# Patient Record
Sex: Female | Born: 1960 | Race: White | Hispanic: No | Smoking: Current every day smoker
Health system: Southern US, Community
[De-identification: ages and names within clinical notes are randomized; demographics above are authoritative.]

## PROBLEM LIST (undated history)

## (undated) DIAGNOSIS — T8859XA Other complications of anesthesia, initial encounter: Secondary | ICD-10-CM

## (undated) DIAGNOSIS — R7303 Prediabetes: Secondary | ICD-10-CM

## (undated) DIAGNOSIS — F419 Anxiety disorder, unspecified: Secondary | ICD-10-CM

## (undated) DIAGNOSIS — G039 Meningitis, unspecified: Secondary | ICD-10-CM

## (undated) DIAGNOSIS — I471 Supraventricular tachycardia, unspecified: Secondary | ICD-10-CM

## (undated) DIAGNOSIS — M539 Dorsopathy, unspecified: Secondary | ICD-10-CM

## (undated) DIAGNOSIS — G5700 Lesion of sciatic nerve, unspecified lower limb: Secondary | ICD-10-CM

## (undated) DIAGNOSIS — I499 Cardiac arrhythmia, unspecified: Secondary | ICD-10-CM

## (undated) DIAGNOSIS — T4145XA Adverse effect of unspecified anesthetic, initial encounter: Secondary | ICD-10-CM

## (undated) HISTORY — PX: DILATION AND CURETTAGE OF UTERUS: SHX78

## (undated) HISTORY — DX: Lesion of sciatic nerve, unspecified lower limb: G57.00

## (undated) HISTORY — DX: Dorsopathy, unspecified: M53.9

## (undated) HISTORY — DX: Supraventricular tachycardia, unspecified: I47.10

## (undated) HISTORY — DX: Supraventricular tachycardia: I47.1

## (undated) HISTORY — PX: FOOT SURGERY: SHX648

---

## 2009-07-21 HISTORY — PX: CERVICAL FUSION: SHX112

## 2015-07-24 ENCOUNTER — Ambulatory Visit: Payer: Self-pay | Admitting: Physician Assistant

## 2015-07-24 ENCOUNTER — Encounter: Payer: Self-pay | Admitting: Physician Assistant

## 2015-07-24 VITALS — BP 152/100 | HR 101 | Temp 97.9°F | Ht 66.0 in | Wt 167.8 lb

## 2015-07-24 DIAGNOSIS — F17219 Nicotine dependence, cigarettes, with unspecified nicotine-induced disorders: Secondary | ICD-10-CM

## 2015-07-24 DIAGNOSIS — J019 Acute sinusitis, unspecified: Secondary | ICD-10-CM

## 2015-07-24 DIAGNOSIS — Z8639 Personal history of other endocrine, nutritional and metabolic disease: Secondary | ICD-10-CM

## 2015-07-24 DIAGNOSIS — I10 Essential (primary) hypertension: Secondary | ICD-10-CM

## 2015-07-24 DIAGNOSIS — Z1329 Encounter for screening for other suspected endocrine disorder: Secondary | ICD-10-CM

## 2015-07-24 DIAGNOSIS — Z131 Encounter for screening for diabetes mellitus: Secondary | ICD-10-CM

## 2015-07-24 MED ORDER — LISINOPRIL 10 MG PO TABS: 10.0000 mg | ORAL_TABLET | Freq: Every day | ORAL | Status: DC

## 2015-07-24 MED ORDER — AZITHROMYCIN 250 MG PO TABS: ORAL_TABLET | ORAL | Status: AC

## 2015-07-24 NOTE — Patient Instructions (Signed)
[facility] with [facility] Think about mammo and PAP Follow up one month

## 2015-07-24 NOTE — Progress Notes (Signed)
BP 152/100 mmHg  Pulse 101  Temp(Src) 97.9 F (36.6 C)  Ht 5\' 6"  (1.676 m)  Wt 167 lb 12 oz (76.091 kg)  BMI 27.09 kg/m2  SpO2 96%   Subjective:    Patient ID: Faith Blair, female    DOB: 06/12/1961, 55 y.o.   MRN: [id number]  HPI: Faith Blair is a 55 y.o. female presenting on 07/24/2015 for Sore Throat; Nasal Congestion; and Ear Pain   URI  This is a new problem. The current episode started 1 to 4 weeks ago (started dec 14). The problem has been gradually improving. There has been no fever. Associated symptoms include chest pain, congestion, coughing, ear pain, headaches, neck pain, a plugged ear sensation, rhinorrhea, sinus pain, sneezing, a sore throat, swollen glands and wheezing. Pertinent negatives include no abdominal pain, diarrhea, dysuria, nausea, rash or vomiting. She has tried sleep, NSAIDs, acetaminophen, antihistamine, decongestant and increased fluids (organic chicken noodle soup) for the symptoms. The treatment provided moderate relief.   Pt was seen in our office one time.  She never got her labs drawn nor followed up.    Relevant past medical, surgical, family and social history reviewed and updated as indicated. Interim medical history since our last visit reviewed. Allergies and medications reviewed and updated.  Review of Systems  Constitutional: Positive for diaphoresis, appetite change and fatigue. Negative for unexpected weight change.  HENT: Positive for congestion, dental problem, ear pain, rhinorrhea, sinus pressure, sneezing, sore throat and voice change. Negative for drooling, facial swelling, hearing loss, mouth sores and trouble swallowing.   Eyes: Positive for pain and itching. Negative for discharge, redness and visual disturbance.  Respiratory: Positive for cough, shortness of breath and wheezing.   Cardiovascular: Positive for chest pain and palpitations. Negative for leg swelling.  Gastrointestinal: Negative for nausea, vomiting, abdominal pain,  diarrhea and constipation.  Endocrine: Positive for heat intolerance. Negative for cold intolerance and polydipsia.  Genitourinary: Negative for dysuria, hematuria and decreased urine volume.  Musculoskeletal: Positive for back pain, arthralgias, gait problem and neck pain.  Skin: Negative for rash.  Allergic/Immunologic: Positive for environmental allergies.  Neurological: Positive for headaches. Negative for seizures, syncope and light-headedness.  Hematological: Positive for adenopathy.  Psychiatric/Behavioral: Positive for dysphoric mood and agitation. Negative for suicidal ideas. The patient is nervous/anxious.     Per HPI unless specifically indicated above     Objective:    BP 152/100 mmHg  Pulse 101  Temp(Src) 97.9 F (36.6 C)  Ht 5\' 6"  (1.676 m)  Wt 167 lb 12 oz (76.091 kg)  BMI 27.09 kg/m2  SpO2 96%  Wt Readings from Last 3 Encounters:  07/24/15 167 lb 12 oz (76.091 kg)    Physical Exam  Constitutional: She is oriented to person, place, and time. She appears well-developed and well-nourished.  HENT:  Head: Normocephalic and atraumatic.  Right Ear: Hearing, tympanic membrane, external ear and ear canal normal.  Left Ear: Hearing, tympanic membrane, external ear and ear canal normal.  Nose: Mucosal edema and rhinorrhea present.  Mouth/Throat: Uvula is midline and oropharynx is clear and moist. No oropharyngeal exudate.  Neck: Neck supple.  Cardiovascular: Normal rate and regular rhythm.   Pulmonary/Chest: Effort normal and breath sounds normal. She has no wheezes.  Lymphadenopathy:    She has no cervical adenopathy.  Neurological: She is alert and oriented to person, place, and time.  Skin: Skin is warm and dry.  Psychiatric: She has a normal mood and affect. Her behavior is normal.  Vitals reviewed.   No results found for this or any previous visit.    Assessment & Plan:   Encounter Diagnoses  Name Primary?  . Acute sinusitis, unspecified Yes  . Essential  hypertension, benign   . History of hyperlipidemia   . Screening for thyroid disorder   . Screening for diabetes mellitus   . Cigarette nicotine dependence with nicotine-induced disorder    -Counseled pt on need for establishing relationship and getting baseline labs so her health can best be managed.  She agrees to get labs drawn and f/u in one month for routine health maintenance. -Rx zpack and gave coupon with it.   -Counseled to avoid smoking to get over URI faster. -Rx lisinopril for BP. -Discussed mammo and PAP but pt unsure if she wants those tests done.  Will discuss further at f/u OV in one month.  RTO sooner prn worsening or new symptoms.

## 2015-07-29 DIAGNOSIS — F17219 Nicotine dependence, cigarettes, with unspecified nicotine-induced disorders: Secondary | ICD-10-CM | POA: Insufficient documentation

## 2015-07-29 DIAGNOSIS — I10 Essential (primary) hypertension: Secondary | ICD-10-CM | POA: Insufficient documentation

## 2015-08-09 ENCOUNTER — Other Ambulatory Visit: Payer: Self-pay | Admitting: Physician Assistant

## 2015-08-09 LAB — CBC WITH DIFFERENTIAL/PLATELET
BASOS ABS: 0 10*3/uL (ref 0.0–0.1)
Basophils Relative: 0 % (ref 0–1)
EOS ABS: 0.3 10*3/uL (ref 0.0–0.7)
Eosinophils Relative: 3 % (ref 0–5)
HEMATOCRIT: 41.2 % (ref 36.0–46.0)
HEMOGLOBIN: 14.2 g/dL (ref 12.0–15.0)
LYMPHS ABS: 2.1 10*3/uL (ref 0.7–4.0)
LYMPHS PCT: 23 % (ref 12–46)
MCH: 30.5 pg (ref 26.0–34.0)
MCHC: 34.5 g/dL (ref 30.0–36.0)
MCV: 88.4 fL (ref 78.0–100.0)
MONOS PCT: 4 % (ref 3–12)
MPV: 9.6 fL (ref 8.6–12.4)
Monocytes Absolute: 0.4 10*3/uL (ref 0.1–1.0)
NEUTROS ABS: 6.5 10*3/uL (ref 1.7–7.7)
NEUTROS PCT: 70 % (ref 43–77)
PLATELETS: 289 10*3/uL (ref 150–400)
RBC: 4.66 MIL/uL (ref 3.87–5.11)
RDW: 13.4 % (ref 11.5–15.5)
WBC: 9.3 10*3/uL (ref 4.0–10.5)

## 2015-08-09 LAB — COMPLETE METABOLIC PANEL WITH GFR
ALT: 17 U/L (ref 6–29)
AST: 17 U/L (ref 10–35)
Albumin: 4.4 g/dL (ref 3.6–5.1)
Alkaline Phosphatase: 99 U/L (ref 33–130)
BUN: 13 mg/dL (ref 7–25)
CHLORIDE: 100 mmol/L (ref 98–110)
CO2: 26 mmol/L (ref 20–31)
Calcium: 10 mg/dL (ref 8.6–10.4)
Creat: 0.42 mg/dL — ABNORMAL LOW (ref 0.50–1.05)
GLUCOSE: 122 mg/dL — AB (ref 65–99)
POTASSIUM: 4.4 mmol/L (ref 3.5–5.3)
SODIUM: 137 mmol/L (ref 135–146)
Total Bilirubin: 0.5 mg/dL (ref 0.2–1.2)
Total Protein: 7.6 g/dL (ref 6.1–8.1)

## 2015-08-09 LAB — LIPID PANEL
CHOL/HDL RATIO: 9.2 ratio — AB (ref <=5.0)
CHOLESTEROL: 295 mg/dL — AB (ref 125–200)
HDL: 32 mg/dL — ABNORMAL LOW (ref >=46)
LDL CALC: 227 mg/dL — AB (ref <130)
Triglycerides: 182 mg/dL — ABNORMAL HIGH (ref <150)
VLDL: 36 mg/dL — AB (ref <30)

## 2015-08-09 LAB — TSH: TSH: 0.86 u[IU]/mL (ref 0.350–4.500)

## 2015-08-10 LAB — HEMOGLOBIN A1C
Hgb A1c MFr Bld: 6.8 % — ABNORMAL HIGH (ref <5.7)
Mean Plasma Glucose: 148 mg/dL — ABNORMAL HIGH (ref <117)

## 2015-08-27 ENCOUNTER — Encounter: Payer: Self-pay | Admitting: Physician Assistant

## 2015-08-27 ENCOUNTER — Ambulatory Visit: Payer: Self-pay | Admitting: Physician Assistant

## 2015-08-27 VITALS — BP 136/84 | HR 93 | Temp 97.9°F | Ht 66.0 in | Wt 164.5 lb

## 2015-08-27 DIAGNOSIS — I10 Essential (primary) hypertension: Secondary | ICD-10-CM

## 2015-08-27 DIAGNOSIS — E785 Hyperlipidemia, unspecified: Secondary | ICD-10-CM

## 2015-08-27 DIAGNOSIS — E119 Type 2 diabetes mellitus without complications: Secondary | ICD-10-CM | POA: Insufficient documentation

## 2015-08-27 DIAGNOSIS — F17219 Nicotine dependence, cigarettes, with unspecified nicotine-induced disorders: Secondary | ICD-10-CM

## 2015-08-27 MED ORDER — EZETIMIBE 10 MG PO TABS: 10.0000 mg | ORAL_TABLET | Freq: Every day | ORAL | Status: DC

## 2015-08-27 MED ORDER — LISINOPRIL 10 MG PO TABS: 10.0000 mg | ORAL_TABLET | Freq: Every day | ORAL | Status: DC

## 2015-08-27 NOTE — Progress Notes (Signed)
BP 136/84 mmHg  Pulse 93  Temp(Src) 97.9 F (36.6 C)  Ht '5\' 6"'$  (1.676 m)  Wt 164 lb 8 oz (74.617 kg)  BMI 26.56 kg/m2  SpO2 98%   Subjective:    Patient ID: Faith Blair, female    DOB: September 11, 1960, 55 y.o.   MRN: [id number]  HPI: Teanna Elem is a 55 y.o. female presenting on 08/27/2015 for Hypertension   HPI   Pt says she is feeling much better since starting lisinopril.  She admits to still being under a lot of stress with her living situation.  Relevant past medical, surgical, family and social history reviewed and updated as indicated. Interim medical history since our last visit reviewed. Allergies and medications reviewed and updated.   Current outpatient prescriptions:  .  Ibuprofen (ADVIL PO), Take 600 mg by mouth 3 (three) times daily as needed. , Disp: , Rfl:  .  lisinopril (PRINIVIL,ZESTRIL) 10 MG tablet, Take 1 tablet (10 mg total) by mouth daily., Disp: 30 tablet, Rfl: 1 .  Omega-3 Fatty Acids (FISH OIL PO), Take 1,000 mg by mouth daily., Disp: , Rfl:    Review of Systems  Constitutional: Positive for fatigue. Negative for fever, chills, diaphoresis, appetite change and unexpected weight change.  HENT: Positive for congestion, dental problem and sneezing. Negative for drooling, ear pain, facial swelling, hearing loss, mouth sores, sore throat, trouble swallowing and voice change.   Eyes: Negative for pain, discharge, redness, itching and visual disturbance.  Respiratory: Positive for shortness of breath. Negative for cough, choking and wheezing.   Cardiovascular: Positive for palpitations. Negative for chest pain and leg swelling.  Gastrointestinal: Negative for vomiting, abdominal pain, diarrhea, constipation and blood in stool.  Endocrine: Negative for cold intolerance, heat intolerance and polydipsia.  Genitourinary: Negative for dysuria, hematuria and decreased urine volume.  Musculoskeletal: Positive for back pain and arthralgias. Negative for gait problem.   Skin: Negative for rash.  Allergic/Immunologic: Positive for environmental allergies.  Neurological: Negative for seizures, syncope, light-headedness and headaches.  Hematological: Negative for adenopathy.  Psychiatric/Behavioral: Negative for suicidal ideas, dysphoric mood and agitation. The patient is nervous/anxious.     Per HPI unless specifically indicated above     Objective:    BP 136/84 mmHg  Pulse 93  Temp(Src) 97.9 F (36.6 C)  Ht '5\' 6"'$  (1.676 m)  Wt 164 lb 8 oz (74.617 kg)  BMI 26.56 kg/m2  SpO2 98%  Wt Readings from Last 3 Encounters:  08/27/15 164 lb 8 oz (74.617 kg)  07/24/15 167 lb 12 oz (76.091 kg)    Physical Exam  Constitutional: She is oriented to person, place, and time. She appears well-developed and well-nourished.  HENT:  Head: Normocephalic and atraumatic.  Neck: Neck supple.  Cardiovascular: Normal rate and regular rhythm.   Pulmonary/Chest: Effort normal and breath sounds normal.  Abdominal: Soft. Bowel sounds are normal. She exhibits no mass. There is no hepatosplenomegaly. There is no tenderness.  Musculoskeletal: She exhibits no edema.  Lymphadenopathy:    She has no cervical adenopathy.  Neurological: She is alert and oriented to person, place, and time.  Skin: Skin is warm and dry.  Psychiatric: She has a normal mood and affect. Her behavior is normal.  Vitals reviewed.   Results for orders placed or performed in visit on 08/09/15  COMPLETE METABOLIC PANEL WITH GFR  Result Value Ref Range   Sodium 137 135 - 146 mmol/L   Potassium 4.4 3.5 - 5.3 mmol/L   Chloride 100 98 -  110 mmol/L   CO2 26 20 - 31 mmol/L   Glucose, Bld 122 (H) 65 - 99 mg/dL   BUN 13 7 - 25 mg/dL   Creat 0.42 (L) 0.50 - 1.05 mg/dL   Total Bilirubin 0.5 0.2 - 1.2 mg/dL   Alkaline Phosphatase 99 33 - 130 U/L   AST 17 10 - 35 U/L   ALT 17 6 - 29 U/L   Total Protein 7.6 6.1 - 8.1 g/dL   Albumin 4.4 3.6 - 5.1 g/dL   Calcium 10.0 8.6 - 10.4 mg/dL   GFR, Est African  American >89 >=60 mL/min   GFR, Est Non African American >89 >=60 mL/min  CBC with Differential/Platelet  Result Value Ref Range   WBC 9.3 4.0 - 10.5 K/uL   RBC 4.66 3.87 - 5.11 MIL/uL   Hemoglobin 14.2 12.0 - 15.0 g/dL   HCT 41.2 36.0 - 46.0 %   MCV 88.4 78.0 - 100.0 fL   MCH 30.5 26.0 - 34.0 pg   MCHC 34.5 30.0 - 36.0 g/dL   RDW 13.4 11.5 - 15.5 %   Platelets 289 150 - 400 K/uL   MPV 9.6 8.6 - 12.4 fL   Neutrophils Relative % 70 43 - 77 %   Neutro Abs 6.5 1.7 - 7.7 K/uL   Lymphocytes Relative 23 12 - 46 %   Lymphs Abs 2.1 0.7 - 4.0 K/uL   Monocytes Relative 4 3 - 12 %   Monocytes Absolute 0.4 0.1 - 1.0 K/uL   Eosinophils Relative 3 0 - 5 %   Eosinophils Absolute 0.3 0.0 - 0.7 K/uL   Basophils Relative 0 0 - 1 %   Basophils Absolute 0.0 0.0 - 0.1 K/uL   Smear Review Criteria for review not met   Lipid panel  Result Value Ref Range   Cholesterol 295 (H) 125 - 200 mg/dL   Triglycerides 182 (H) <150 mg/dL   HDL 32 (L) >=46 mg/dL   Total CHOL/HDL Ratio 9.2 (H) <=5.0 Ratio   VLDL 36 (H) <30 mg/dL   LDL Cholesterol 227 (H) <130 mg/dL  TSH  Result Value Ref Range   TSH 0.860 0.350 - 4.500 uIU/mL  Hemoglobin A1c  Result Value Ref Range   Hgb A1c MFr Bld 6.8 (H) <5.7 %   Mean Plasma Glucose 148 (H) <117 mg/dL      Assessment & Plan:   Encounter Diagnoses  Name Primary?  . Essential hypertension, benign Yes  . Cigarette nicotine dependence with nicotine-induced disorder   . Type 2 diabetes mellitus without complication, unspecified long term insulin use status ([place])   . Hyperlipidemia     -reviewed labs with pt  -Will do pap here later- at next OV -Pt wants to skip mammo.  Discussed risks of not getting test.  Pt says she may change her mind in the future but she does not want a mammogram at this time -Pt doesn't want pills for dm.  She will try to improve with diet and exercise.  Discussed risks of untreated diabetes. Pt says she wants to try to treat it without  medication b/c she doesn't want to be taking a lot of pills.  counseled pt on diabetic diet and gave reading information.  She will need to attend dm educuation class -Discussed chantix- pt says it didn't work for her and it made her hyper. Counseled on smoking cessation -will sign up pt  for [facility]- order zetia for her cholesterol.  She will follow  low-fat diet and exercise as well. -F/u 20mo She is to RTO sooner prn. -diabetic foot exam at next OV

## 2015-08-27 NOTE — Patient Instructions (Addendum)
Diabetes Mellitus and Food It is important for you to manage your blood sugar (glucose) level. Your blood glucose level can be greatly affected by what you eat. Eating healthier foods in the appropriate amounts throughout the day at about the same time each day will help you control your blood glucose level. It can also help slow or prevent worsening of your diabetes mellitus. Healthy eating may even help you improve the level of your blood pressure and reach or maintain a healthy weight.  General recommendations for healthful eating and cooking habits include:  Eating meals and snacks regularly. Avoid going long periods of time without eating to lose weight.  Eating a diet that consists mainly of plant-based foods, such as fruits, vegetables, nuts, legumes, and whole grains.  Using low-heat cooking methods, such as baking, instead of high-heat cooking methods, such as deep frying. Work with your dietitian to make sure you understand how to use the Nutrition Facts information on food labels. HOW CAN FOOD AFFECT ME? Carbohydrates Carbohydrates affect your blood glucose level more than any other type of food. Your dietitian will help you determine how many carbohydrates to eat at each meal and teach you how to count carbohydrates. Counting carbohydrates is important to keep your blood glucose at a healthy level, especially if you are using insulin or taking certain medicines for diabetes mellitus. Alcohol Alcohol can cause sudden decreases in blood glucose (hypoglycemia), especially if you use insulin or take certain medicines for diabetes mellitus. Hypoglycemia can be a life-threatening condition. Symptoms of hypoglycemia (sleepiness, dizziness, and disorientation) are similar to symptoms of having too much alcohol.  If your health care provider has given you approval to drink alcohol, do so in moderation and use the following guidelines:  Women should not have more than one drink per day, and men  should not have more than two drinks per day. One drink is equal to:  12 oz of beer.  5 oz of wine.  1 oz of hard liquor.  Do not drink on an empty stomach.  Keep yourself hydrated. Have water, diet soda, or unsweetened iced tea.  Regular soda, juice, and other mixers might contain a lot of carbohydrates and should be counted. WHAT FOODS ARE NOT RECOMMENDED? As you make food choices, it is important to remember that all foods are not the same. Some foods have fewer nutrients per serving than other foods, even though they might have the same number of calories or carbohydrates. It is difficult to get your body what it needs when you eat foods with fewer nutrients. Examples of foods that you should avoid that are high in calories and carbohydrates but low in nutrients include:  Trans fats (most processed foods list trans fats on the Nutrition Facts label).  Regular soda.  Juice.  Candy.  Sweets, such as cake, pie, doughnuts, and cookies.  Fried foods. WHAT FOODS CAN I EAT? Eat nutrient-rich foods, which will nourish your body and keep you healthy. The food you should eat also will depend on several factors, including:  The calories you need.  The medicines you take.  Your weight.  Your blood glucose level.  Your blood pressure level.  Your cholesterol level. You should eat a variety of foods, including:  Protein.  Lean cuts of meat.  Proteins low in saturated fats, such as fish, egg whites, and beans. Avoid processed meats.  Fruits and vegetables.  Fruits and vegetables that may help control blood glucose levels, such as apples, mangoes, and   yams.  Dairy products.  Choose fat-free or low-fat dairy products, such as milk, yogurt, and cheese.  Grains, bread, pasta, and rice.  Choose whole grain products, such as multigrain bread, whole oats, and brown rice. These foods may help control blood pressure.  Fats.  Foods containing healthful fats, such as nuts,  avocado, olive oil, canola oil, and fish. DOES EVERYONE WITH DIABETES MELLITUS HAVE THE SAME MEAL PLAN? Because every person with diabetes mellitus is different, there is not one meal plan that works for everyone. It is very important that you meet with a dietitian who will help you create a meal plan that is just right for you.   This information is not intended to replace advice given to you by your health care provider. Make sure you discuss any questions you have with your health care provider.   Document Released: 04/03/2005 Document Revised: 07/28/2014 Document Reviewed: 06/03/2013 Elsevier Interactive Patient Education 2016 [employer]    Fat and Cholesterol Restricted Diet High levels of fat and cholesterol in your blood may lead to various health problems, such as diseases of the heart, blood vessels, gallbladder, liver, and pancreas. Fats are concentrated sources of energy that come in various forms. Certain types of fat, including saturated fat, may be harmful in excess. Cholesterol is a substance needed by your body in small amounts. Your body makes all the cholesterol it needs. Excess cholesterol comes from the food you eat. When you have high levels of cholesterol and saturated fat in your blood, health problems can develop because the excess fat and cholesterol will gather along the walls of your blood vessels, causing them to narrow. Choosing the right foods will help you control your intake of fat and cholesterol. This will help keep the levels of these substances in your blood within normal limits and reduce your risk of disease. WHAT IS MY PLAN? Your health care provider recommends that you:  Get no more than __________ % of the total calories in your daily diet from fat.  Limit your intake of saturated fat to less than ______% of your total calories each day.  Limit the amount of cholesterol in your diet to less than _________mg per day. WHAT TYPES OF FAT SHOULD I  CHOOSE?  Choose healthy fats more often. Choose monounsaturated and polyunsaturated fats, such as olive and canola oil, flaxseeds, walnuts, almonds, and seeds.  Eat more omega-3 fats. Good choices include salmon, mackerel, sardines, tuna, flaxseed oil, and ground flaxseeds. Aim to eat fish at least two times a week.  Limit saturated fats. Saturated fats are primarily found in animal products, such as meats, butter, and cream. Plant sources of saturated fats include palm oil, palm kernel oil, and coconut oil.  Avoid foods with partially hydrogenated oils in them. These contain trans fats. Examples of foods that contain trans fats are stick margarine, some tub margarines, cookies, crackers, and other baked goods. WHAT GENERAL GUIDELINES DO I NEED TO FOLLOW? These guidelines for healthy eating will help you control your intake of fat and cholesterol:  Check food labels carefully to identify foods with trans fats or high amounts of saturated fat.  Fill one half of your plate with vegetables and green salads.  Fill one fourth of your plate with whole grains. Look for the word "whole" as the first word in the ingredient list.  Fill one fourth of your plate with lean protein foods.  Limit fruit to two servings a day. Choose fruit instead of juice.    Eat more foods that contain soluble fiber. Examples of foods that contain this type of fiber are apples, broccoli, carrots, beans, peas, and barley. Aim to get 20-30 g of fiber per day.  Eat more home-cooked food and less restaurant, buffet, and fast food.  Limit or avoid alcohol.  Limit foods high in starch and sugar.  Limit fried foods.  Cook foods using methods other than frying. Baking, boiling, grilling, and broiling are all great options.  Lose weight if you are overweight. Losing just 5-10% of your initial body weight can help your overall health and prevent diseases such as diabetes and heart disease. WHAT FOODS CAN I  EAT? Grains Whole grains, such as whole wheat or whole grain breads, crackers, cereals, and pasta. Unsweetened oatmeal, bulgur, barley, quinoa, or brown rice. Corn or whole wheat flour tortillas. Vegetables Fresh or frozen vegetables (raw, steamed, roasted, or grilled). Green salads. Fruits All fresh, canned (in natural juice), or frozen fruits. Meat and Other Protein Products Ground beef (85% or leaner), grass-fed beef, or beef trimmed of fat. Skinless chicken or turkey. Ground chicken or turkey. Pork trimmed of fat. All fish and seafood. Eggs. Dried beans, peas, or lentils. Unsalted nuts or seeds. Unsalted canned or dry beans. Dairy Low-fat dairy products, such as skim or 1% milk, 2% or reduced-fat cheeses, low-fat ricotta or cottage cheese, or plain low-fat yogurt. Fats and Oils Tub margarines without trans fats. Light or reduced-fat mayonnaise and salad dressings. Avocado. Olive, canola, sesame, or safflower oils. Natural peanut or almond butter (choose ones without added sugar and oil). The items listed above may not be a complete list of recommended foods or beverages. Contact your dietitian for more options. WHAT FOODS ARE NOT RECOMMENDED? Grains White bread. White pasta. White rice. Cornbread. Bagels, pastries, and croissants. Crackers that contain trans fat. Vegetables White potatoes. Corn. Creamed or fried vegetables. Vegetables in a cheese sauce. Fruits Dried fruits. Canned fruit in light or heavy syrup. Fruit juice. Meat and Other Protein Products Fatty cuts of meat. Ribs, chicken wings, bacon, sausage, bologna, salami, chitterlings, fatback, hot dogs, bratwurst, and packaged luncheon meats. Liver and organ meats. Dairy Whole or 2% milk, cream, half-and-half, and cream cheese. Whole milk cheeses. Whole-fat or sweetened yogurt. Full-fat cheeses. Nondairy creamers and whipped toppings. Processed cheese, cheese spreads, or cheese curds. Sweets and Desserts Corn syrup, sugars,  honey, and molasses. Candy. Jam and jelly. Syrup. Sweetened cereals. Cookies, pies, cakes, donuts, muffins, and ice cream. Fats and Oils Butter, stick margarine, lard, shortening, ghee, or bacon fat. Coconut, palm kernel, or palm oils. Beverages Alcohol. Sweetened drinks (such as sodas, lemonade, and fruit drinks or punches). The items listed above may not be a complete list of foods and beverages to avoid. Contact your dietitian for more information.   This information is not intended to replace advice given to you by your health care provider. Make sure you discuss any questions you have with your health care provider.   Document Released: 07/07/2005 Document Revised: 07/28/2014 Document Reviewed: 10/05/2013 Elsevier Interactive Patient Education 2016 [employer]  

## 2015-08-28 ENCOUNTER — Other Ambulatory Visit: Payer: Self-pay | Admitting: Physician Assistant

## 2015-08-28 ENCOUNTER — Ambulatory Visit: Payer: Self-pay | Admitting: Physician Assistant

## 2015-08-28 MED ORDER — EZETIMIBE 10 MG PO TABS: 10.0000 mg | ORAL_TABLET | Freq: Every day | ORAL | Status: DC

## 2015-11-14 ENCOUNTER — Other Ambulatory Visit: Payer: Self-pay

## 2015-11-14 DIAGNOSIS — E785 Hyperlipidemia, unspecified: Secondary | ICD-10-CM

## 2015-11-14 DIAGNOSIS — I10 Essential (primary) hypertension: Secondary | ICD-10-CM

## 2015-11-14 DIAGNOSIS — E119 Type 2 diabetes mellitus without complications: Secondary | ICD-10-CM

## 2015-11-16 LAB — LIPID PANEL
Cholesterol: 280 mg/dL — ABNORMAL HIGH (ref 125–200)
HDL: 37 mg/dL — ABNORMAL LOW (ref >=46)
LDL Cholesterol: 194 mg/dL — ABNORMAL HIGH (ref <130)
Total CHOL/HDL Ratio: 7.6 Ratio — ABNORMAL HIGH (ref <=5.0)
Triglycerides: 244 mg/dL — ABNORMAL HIGH (ref <150)
VLDL: 49 mg/dL — AB (ref <30)

## 2015-11-16 LAB — COMPLETE METABOLIC PANEL WITH GFR
ALBUMIN: 4.6 g/dL (ref 3.6–5.1)
ALK PHOS: 92 U/L (ref 33–130)
ALT: 12 U/L (ref 6–29)
AST: 14 U/L (ref 10–35)
BUN: 19 mg/dL (ref 7–25)
CO2: 26 mmol/L (ref 20–31)
Calcium: 9.9 mg/dL (ref 8.6–10.4)
Chloride: 99 mmol/L (ref 98–110)
Creat: 0.69 mg/dL (ref 0.50–1.05)
GFR, Est African American: >89 mL/min (ref >=60)
GFR, Est Non African American: >89 mL/min (ref >=60)
GLUCOSE: 105 mg/dL — AB (ref 65–99)
POTASSIUM: 4.7 mmol/L (ref 3.5–5.3)
SODIUM: 137 mmol/L (ref 135–146)
Total Bilirubin: 0.4 mg/dL (ref 0.2–1.2)
Total Protein: 7.8 g/dL (ref 6.1–8.1)

## 2015-11-16 LAB — HEMOGLOBIN A1C
HEMOGLOBIN A1C: 6.3 % — AB (ref <5.7)
MEAN PLASMA GLUCOSE: 134 mg/dL

## 2015-11-17 LAB — MICROALBUMIN, URINE: MICROALB UR: 0.7 mg/dL

## 2015-11-22 ENCOUNTER — Encounter: Payer: Self-pay | Admitting: Physician Assistant

## 2015-11-22 ENCOUNTER — Ambulatory Visit: Payer: Self-pay | Admitting: Physician Assistant

## 2015-11-22 ENCOUNTER — Other Ambulatory Visit: Payer: Self-pay | Admitting: Physician Assistant

## 2015-11-22 VITALS — BP 100/66 | HR 100 | Temp 97.9°F | Ht 66.0 in | Wt 160.2 lb

## 2015-11-22 DIAGNOSIS — E119 Type 2 diabetes mellitus without complications: Secondary | ICD-10-CM

## 2015-11-22 DIAGNOSIS — E785 Hyperlipidemia, unspecified: Secondary | ICD-10-CM

## 2015-11-22 DIAGNOSIS — I10 Essential (primary) hypertension: Secondary | ICD-10-CM

## 2015-11-22 DIAGNOSIS — Z124 Encounter for screening for malignant neoplasm of cervix: Secondary | ICD-10-CM

## 2015-11-22 DIAGNOSIS — F17210 Nicotine dependence, cigarettes, uncomplicated: Secondary | ICD-10-CM

## 2015-11-22 DIAGNOSIS — Z1211 Encounter for screening for malignant neoplasm of colon: Secondary | ICD-10-CM

## 2015-11-22 MED ORDER — COLESEVELAM HCL 625 MG PO TABS: 1875.0000 mg | ORAL_TABLET | Freq: Two times a day (BID) | ORAL | Status: DC

## 2015-11-22 NOTE — Progress Notes (Signed)
BP 100/66 mmHg  Pulse 100  Temp(Src) 97.9 F (36.6 C)  Ht 5\' 6"  (1.676 m)  Wt 160 lb 3.2 oz (72.666 kg)  BMI 25.87 kg/m2  SpO2 97%   Subjective:    Patient ID: Faith Blair, female    DOB: 07/01/1961, [age] y.o.   MRN: [id number]  HPI: Faith Lamerani Gracey is a 55 y.o. female presenting on 11/22/2015 for Diabetes and Gynecologic Exam   HPI   Pt stopped her zetia- she only took it for about a week b/c she says it made her have hot flashes.  Pt took lipitor in the past and it made her kidneys hurt.  She never tried any other statins.  Pt still doesn't want mammogram  She is still smoking  Ready for pap today- last one 4 years ago was normal. Has 2 kids.  LMP  10/18/15  Relevant past medical, surgical, family and social history reviewed and updated as indicated. Interim medical history since our last visit reviewed.  Allergies and medications reviewed and updated.   Current outpatient prescriptions:  .  Ibuprofen (ADVIL PO), Take 600 mg by mouth 3 (three) times daily as needed. , Disp: , Rfl:  .  lisinopril (PRINIVIL,ZESTRIL) 10 MG tablet, Take 1 tablet (10 mg total) by mouth daily., Disp: 30 tablet, Rfl: 3 .  Omega-3 Fatty Acids (FISH OIL PO), Take 1,000 mg by mouth daily., Disp: , Rfl:    Review of Systems  Constitutional: Positive for diaphoresis. Negative for fever, chills, appetite change, fatigue and unexpected weight change.  HENT: Positive for congestion, dental problem and sneezing. Negative for drooling, ear pain, facial swelling, hearing loss, mouth sores, sore throat, trouble swallowing and voice change.   Eyes: Positive for itching. Negative for pain, discharge, redness and visual disturbance.  Respiratory: Positive for cough and wheezing. Negative for choking and shortness of breath.   Cardiovascular: Positive for palpitations. Negative for chest pain and leg swelling.  Gastrointestinal: Negative for vomiting, abdominal pain, diarrhea, constipation and blood in stool.   Endocrine: Positive for heat intolerance. Negative for cold intolerance and polydipsia.  Genitourinary: Negative for dysuria, hematuria and decreased urine volume.  Musculoskeletal: Positive for back pain and arthralgias. Negative for gait problem.  Skin: Negative for rash.  Allergic/Immunologic: Positive for environmental allergies.  Neurological: Negative for seizures, syncope, light-headedness and headaches.  Hematological: Negative for adenopathy.  Psychiatric/Behavioral: Negative for suicidal ideas, dysphoric mood and agitation. The patient is nervous/anxious.     Per HPI unless specifically indicated above     Objective:    BP 100/66 mmHg  Pulse 100  Temp(Src) 97.9 F (36.6 C)  Ht 5\' 6"  (1.676 m)  Wt 160 lb 3.2 oz (72.666 kg)  BMI 25.87 kg/m2  SpO2 97%  Wt Readings from Last 3 Encounters:  11/22/15 160 lb 3.2 oz (72.666 kg)  08/27/15 164 lb 8 oz (74.617 kg)  07/24/15 167 lb 12 oz (76.091 kg)    Physical Exam  Constitutional: She is oriented to person, place, and time. She appears well-developed and well-nourished.  Neck: Neck supple.  Cardiovascular: Normal rate, regular rhythm and normal heart sounds.   Pulmonary/Chest: Effort normal and breath sounds normal.  Abdominal: Soft. Bowel sounds are normal. She exhibits no mass. There is no tenderness.  Genitourinary: Vagina normal. No breast swelling, tenderness, discharge or bleeding. Uterus is not enlarged and not tender. Cervix exhibits no motion tenderness, no discharge and no friability. Right adnexum displays no mass, no tenderness and no fullness. Left adnexum  displays no mass, no tenderness and no fullness.  (nurse Berenice assisted)  Neurological: She is alert and oriented to person, place, and time.  Skin: Skin is warm and dry.  Psychiatric: She has a normal mood and affect. Her behavior is normal.  Nursing note and vitals reviewed.   Foot exam done   Results for orders placed or performed in visit on 11/14/15   COMPLETE METABOLIC PANEL WITH GFR  Result Value Ref Range   Sodium 137 135 - 146 mmol/L   Potassium 4.7 3.5 - 5.3 mmol/L   Chloride 99 98 - 110 mmol/L   CO2 26 20 - 31 mmol/L   Glucose, Bld 105 (H) 65 - 99 mg/dL   BUN 19 7 - 25 mg/dL   Creat 1.61 0.96 - 0.45 mg/dL   Total Bilirubin 0.4 0.2 - 1.2 mg/dL   Alkaline Phosphatase 92 33 - 130 U/L   AST 14 10 - 35 U/L   ALT 12 6 - 29 U/L   Total Protein 7.8 6.1 - 8.1 g/dL   Albumin 4.6 3.6 - 5.1 g/dL   Calcium 9.9 8.6 - 40.9 mg/dL   GFR, Est African American >89 >=60 mL/min   GFR, Est Non African American >89 >=60 mL/min  Microalbumin, urine  Result Value Ref Range   Microalb, Ur 0.7 Not estab mg/dL  Lipid Profile  Result Value Ref Range   Cholesterol 280 (H) 125 - 200 mg/dL   Triglycerides 811 (H) <150 mg/dL   HDL 37 (L) >=91 mg/dL   Total CHOL/HDL Ratio 7.6 (H) <=5.0 Ratio   VLDL 49 (H) <30 mg/dL   LDL Cholesterol 478 (H) <130 mg/dL  HgB G9F  Result Value Ref Range   Hgb A1c MFr Bld 6.3 (H) <5.7 %   Mean Plasma Glucose 134 mg/dL      Assessment & Plan:   Encounter Diagnoses  Name Primary?  . Essential hypertension, benign Yes  . Cigarette nicotine dependence without complication   . Hyperlipidemia   . Pap smear for cervical cancer screening   . Diabetes mellitus type 2, diet-controlled (HCC)   . Special screening for malignant neoplasms, colon     -Reviewed labs with pt -Will call with pap result -Try welchol for liids. Samples given -Diet- controlled diabetes- discussed with pt that she will have to continue to watch her diet, exercise regularly in order to avoid medication.  Also, with time, the disease likely to progress so that she will need medication in the future -Pt went to eye doctor 10/22/15 at [facility]-  Record request sent -Gave iFOBT for colon cancer screening -Counseled smoking cessation -F/u 6 wk med check (welchol) (spent 45 minutes with pt)

## 2015-11-23 LAB — PAP, THINPREP RFLX HPV

## 2015-11-24 DIAGNOSIS — E119 Type 2 diabetes mellitus without complications: Secondary | ICD-10-CM | POA: Insufficient documentation

## 2015-11-24 DIAGNOSIS — F17210 Nicotine dependence, cigarettes, uncomplicated: Secondary | ICD-10-CM | POA: Insufficient documentation

## 2016-01-07 ENCOUNTER — Encounter: Payer: Self-pay | Admitting: Physician Assistant

## 2016-01-07 ENCOUNTER — Ambulatory Visit: Payer: Self-pay | Admitting: Physician Assistant

## 2016-01-07 VITALS — BP 128/70 | HR 98 | Temp 97.7°F | Ht 66.0 in | Wt 162.6 lb

## 2016-01-07 DIAGNOSIS — F17219 Nicotine dependence, cigarettes, with unspecified nicotine-induced disorders: Secondary | ICD-10-CM

## 2016-01-07 DIAGNOSIS — I10 Essential (primary) hypertension: Secondary | ICD-10-CM

## 2016-01-07 DIAGNOSIS — E785 Hyperlipidemia, unspecified: Secondary | ICD-10-CM

## 2016-01-07 DIAGNOSIS — E119 Type 2 diabetes mellitus without complications: Secondary | ICD-10-CM

## 2016-01-07 NOTE — Progress Notes (Signed)
BP 128/70 mmHg  Pulse 98  Temp(Src) 97.7 F (36.5 C)  Ht  (1.676 m)  Wt 162 lb 9.6 oz (73.755 kg)  BMI 26.26 kg/m2  SpO2 96%   Subjective:    Patient ID: Faith Blair, female    DOB: Dec 11, 1960, 55 y.o.   MRN: [id number]  HPI: Faith Blair is a 55 y.o. female presenting on 01/07/2016 for Hyperlipidemia and Medication Management   HPI   Pt is doing well with the welchol.  No side effects that she has noticed.    Pt c/o lots of stress lately with her pet died recently and her mother is in final stages of dementia.   Relevant past medical, surgical, family and social history reviewed and updated as indicated. Interim medical history since our last visit reviewed. Allergies and medications reviewed and updated.   Current outpatient prescriptions:  .  Ibuprofen (ADVIL PO), Take 600 mg by mouth 3 (three) times daily as needed. , Disp: , Rfl:  .  lisinopril (PRINIVIL,ZESTRIL) 10 MG tablet, Take 1 tablet (10 mg total) by mouth daily., Disp: 30 tablet, Rfl: 3 .  colesevelam (WELCHOL) 625 MG tablet, Take 3 tablets (1,875 mg total) by mouth 2 (two) times daily with a meal. (Patient not taking: Reported on 01/07/2016), Disp: 540 tablet, Rfl: 1 .  Omega-3 Fatty Acids (FISH OIL PO), Take 1,000 mg by mouth daily. Reported on 01/07/2016, Disp: , Rfl:    Review of Systems  Constitutional: Positive for chills, diaphoresis and fatigue. Negative for fever, appetite change and unexpected weight change.  HENT: Positive for congestion, dental problem and sneezing. Negative for drooling, ear pain, facial swelling, hearing loss, mouth sores, sore throat, trouble swallowing and voice change.   Eyes: Positive for itching. Negative for pain, discharge, redness and visual disturbance.  Respiratory: Negative for cough, choking, shortness of breath and wheezing.   Cardiovascular: Negative for chest pain, palpitations and leg swelling.  Gastrointestinal: Negative for vomiting, abdominal pain, diarrhea,  constipation and blood in stool.  Endocrine: Positive for heat intolerance. Negative for cold intolerance and polydipsia.  Genitourinary: Negative for dysuria, hematuria and decreased urine volume.  Musculoskeletal: Positive for back pain and arthralgias. Negative for gait problem.  Skin: Negative for rash.  Allergic/Immunologic: Positive for environmental allergies.  Neurological: Negative for seizures, syncope, light-headedness and headaches.  Hematological: Negative for adenopathy.  Psychiatric/Behavioral: Negative for suicidal ideas, dysphoric mood and agitation. The patient is nervous/anxious.     Per HPI unless specifically indicated above     Objective:    BP 128/70 mmHg  Pulse 98  Temp(Src) 97.7 F (36.5 C)  Ht  (1.676 m)  Wt 162 lb 9.6 oz (73.755 kg)  BMI 26.26 kg/m2  SpO2 96%  Wt Readings from Last 3 Encounters:  01/07/16 162 lb 9.6 oz (73.755 kg)  11/22/15 160 lb 3.2 oz (72.666 kg)  08/27/15 164 lb 8 oz (74.617 kg)    Physical Exam  Constitutional: She is oriented to person, place, and time. She appears well-developed and well-nourished.  HENT:  Head: Normocephalic and atraumatic.  Neck: Neck supple.  Cardiovascular: Normal rate and regular rhythm.   Pulmonary/Chest: Effort normal and breath sounds normal.  Abdominal: Soft. Bowel sounds are normal. She exhibits no mass. There is no hepatosplenomegaly. There is no tenderness.  Musculoskeletal: She exhibits no edema.  Lymphadenopathy:    She has no cervical adenopathy.  Neurological: She is alert and oriented to person, place, and time.  Skin: Skin is warm  and dry.  Psychiatric: She has a normal mood and affect. Her behavior is normal.  Vitals reviewed.       Assessment & Plan:   Encounter Diagnoses  Name Primary?  . Hyperlipidemia Yes  . Cigarette nicotine dependence with nicotine-induced disorder   . Essential hypertension, benign   . Diabetes mellitus type 2, diet-controlled (HCC)       -Counseled smoking/osteoporosis relationship which could lead to pain in the future. Counseled on smoking cessation -pt not inclined [facility].  [facility] [place] [facility] -F/u 2 months. RTO sooner prn

## 2016-01-24 ENCOUNTER — Other Ambulatory Visit: Payer: Self-pay | Admitting: Physician Assistant

## 2016-03-03 ENCOUNTER — Other Ambulatory Visit: Payer: Self-pay

## 2016-03-03 DIAGNOSIS — E785 Hyperlipidemia, unspecified: Secondary | ICD-10-CM

## 2016-03-03 DIAGNOSIS — I10 Essential (primary) hypertension: Secondary | ICD-10-CM

## 2016-03-03 DIAGNOSIS — E119 Type 2 diabetes mellitus without complications: Secondary | ICD-10-CM

## 2016-03-06 LAB — COMPLETE METABOLIC PANEL WITH GFR
ALBUMIN: 4.7 g/dL (ref 3.6–5.1)
ALK PHOS: 91 U/L (ref 33–130)
ALT: 14 U/L (ref 6–29)
AST: 16 U/L (ref 10–35)
BILIRUBIN TOTAL: 0.3 mg/dL (ref 0.2–1.2)
BUN: 14 mg/dL (ref 7–25)
CO2: 28 mmol/L (ref 20–31)
CREATININE: 0.74 mg/dL (ref 0.50–1.05)
Calcium: 9.8 mg/dL (ref 8.6–10.4)
Chloride: 100 mmol/L (ref 98–110)
GFR, Est Non African American: >89 mL/min (ref >=60)
GLUCOSE: 99 mg/dL (ref 65–99)
Potassium: 4.7 mmol/L (ref 3.5–5.3)
SODIUM: 136 mmol/L (ref 135–146)
TOTAL PROTEIN: 7.6 g/dL (ref 6.1–8.1)

## 2016-03-06 LAB — HEMOGLOBIN A1C
Hgb A1c MFr Bld: 6.1 % — ABNORMAL HIGH (ref <5.7)
Mean Plasma Glucose: 128 mg/dL

## 2016-03-06 LAB — LIPID PANEL
CHOL/HDL RATIO: 8.7 ratio — AB (ref <=5.0)
Cholesterol: 296 mg/dL — ABNORMAL HIGH (ref 125–200)
HDL: 34 mg/dL — ABNORMAL LOW (ref >=46)
Triglycerides: 508 mg/dL — ABNORMAL HIGH (ref <150)

## 2016-03-11 ENCOUNTER — Ambulatory Visit: Payer: Self-pay | Admitting: Physician Assistant

## 2016-03-11 ENCOUNTER — Encounter: Payer: Self-pay | Admitting: Physician Assistant

## 2016-03-11 VITALS — BP 136/80 | HR 97 | Temp 97.9°F | Ht 66.0 in | Wt 163.8 lb

## 2016-03-11 DIAGNOSIS — F17210 Nicotine dependence, cigarettes, uncomplicated: Secondary | ICD-10-CM

## 2016-03-11 DIAGNOSIS — E785 Hyperlipidemia, unspecified: Secondary | ICD-10-CM

## 2016-03-11 DIAGNOSIS — I10 Essential (primary) hypertension: Secondary | ICD-10-CM

## 2016-03-11 DIAGNOSIS — E119 Type 2 diabetes mellitus without complications: Secondary | ICD-10-CM

## 2016-03-11 NOTE — Progress Notes (Signed)
BP 136/80 (BP Location: Left Arm, Patient Position: Sitting, Cuff Size: Normal)   Pulse 97   Temp 97.9 F (36.6 C)   Ht 5\' 6"  (1.676 m)   Wt 163 lb 12.8 oz (74.3 kg)   SpO2 99%   BMI 26.44 kg/m    Subjective:    Patient ID: Faith Blair, female    DOB: 11/23/1960, 55 y.o.   MRN: [id number]  HPI: Faith Blair is a 55 y.o. female presenting on 03/11/2016 for Hyperlipidemia and Hypertension   HPI   Pt ran out of her welchol - none since June- she doesn't have papers needed to be turned in to complete pt assistance for this medication.  She says she should have it soon.  Pt's mother died.  She says she is "grumpy" but is dealing with it okay.  She is still uninterested in seeing MH specialist.  Pt says she doesn't want mammogram even after discussing screening guidelines and benefits  Pt hasn't returned iFOBT.  She says it's "icky"  Relevant past medical, surgical, family and social history reviewed and updated as indicated. Interim medical history since our last visit reviewed. Allergies and medications reviewed and updated.   Current Outpatient Prescriptions:  .  Ibuprofen (ADVIL PO), Take 600 mg by mouth 3 (three) times daily as needed. , Disp: , Rfl:  .  lisinopril (PRINIVIL,ZESTRIL) 10 MG tablet, TAKE ONE TABLET BY MOUTH ONCE DAILY, Disp: 30 tablet, Rfl: 3 .  Omega-3 Fatty Acids (FISH OIL PO), Take 1,000 mg by mouth daily. Reported on 01/07/2016, Disp: , Rfl:  .  colesevelam (WELCHOL) 625 MG tablet, Take 3 tablets (1,875 mg total) by mouth 2 (two) times daily with a meal. (Patient not taking: Reported on 01/07/2016), Disp: 540 tablet, Rfl: 1   Review of Systems  Constitutional: Positive for chills, diaphoresis and fatigue. Negative for appetite change, fever and unexpected weight change.  HENT: Positive for congestion and sneezing. Negative for drooling, ear pain, facial swelling, hearing loss, mouth sores, sore throat, trouble swallowing and voice change.   Eyes: Positive for  visual disturbance. Negative for pain, discharge, redness and itching.  Respiratory: Positive for cough. Negative for choking, shortness of breath and wheezing.   Cardiovascular: Positive for palpitations. Negative for chest pain and leg swelling.  Gastrointestinal: Negative for abdominal pain, blood in stool, constipation, diarrhea and vomiting.  Endocrine: Positive for heat intolerance. Negative for cold intolerance and polydipsia.  Genitourinary: Negative for decreased urine volume, dysuria and hematuria.  Musculoskeletal: Positive for arthralgias and back pain. Negative for gait problem.  Skin: Negative for rash.  Allergic/Immunologic: Negative for environmental allergies.  Neurological: Negative for seizures, syncope, light-headedness and headaches.  Hematological: Negative for adenopathy.  Psychiatric/Behavioral: Positive for agitation. Negative for dysphoric mood and suicidal ideas. The patient is nervous/anxious.     Per HPI unless specifically indicated above     Objective:    BP 136/80 (BP Location: Left Arm, Patient Position: Sitting, Cuff Size: Normal)   Pulse 97   Temp 97.9 F (36.6 C)   Ht 5\' 6"  (1.676 m)   Wt 163 lb 12.8 oz (74.3 kg)   SpO2 99%   BMI 26.44 kg/m   Wt Readings from Last 3 Encounters:  03/11/16 163 lb 12.8 oz (74.3 kg)  01/07/16 162 lb 9.6 oz (73.8 kg)  11/22/15 160 lb 3.2 oz (72.7 kg)    Physical Exam  Constitutional: She is oriented to person, place, and time. She appears well-developed and well-nourished.  HENT:  Head: Normocephalic and atraumatic.  Neck: Neck supple.  Cardiovascular: Normal rate and regular rhythm.   Pulmonary/Chest: Effort normal and breath sounds normal.  Abdominal: Soft. Bowel sounds are normal. She exhibits no mass. There is no hepatosplenomegaly. There is no tenderness.  Musculoskeletal: She exhibits no edema.  Lymphadenopathy:    She has no cervical adenopathy.  Neurological: She is alert and oriented to person,  place, and time.  Skin: Skin is warm and dry.  Psychiatric: She has a normal mood and affect. Her behavior is normal.  Vitals reviewed.      Results for orders placed or performed in visit on 03/03/16  COMPLETE METABOLIC PANEL WITH GFR  Result Value Ref Range   Sodium 136 135 - 146 mmol/L   Potassium 4.7 3.5 - 5.3 mmol/L   Chloride 100 98 - 110 mmol/L   CO2 28 20 - 31 mmol/L   Glucose, Bld 99 65 - 99 mg/dL   BUN 14 7 - 25 mg/dL   Creat 4.250.74 9.560.50 - 3.871.05 mg/dL   Total Bilirubin 0.3 0.2 - 1.2 mg/dL   Alkaline Phosphatase 91 33 - 130 U/L   AST 16 10 - 35 U/L   ALT 14 6 - 29 U/L   Total Protein 7.6 6.1 - 8.1 g/dL   Albumin 4.7 3.6 - 5.1 g/dL   Calcium 9.8 8.6 - 56.410.4 mg/dL   GFR, Est African American >89 >=60 mL/min   GFR, Est Non African American >89 >=60 mL/min  HgB A1c  Result Value Ref Range   Hgb A1c MFr Bld 6.1 (H) <5.7 %   Mean Plasma Glucose 128 mg/dL  Lipid Profile  Result Value Ref Range   Cholesterol 296 (H) 125 - 200 mg/dL   Triglycerides 332508 (H) <150 mg/dL   HDL 34 (L) >=95>=46 mg/dL   Total CHOL/HDL Ratio 8.7 (H) <=5.0 Ratio   VLDL NOT CALC <30 mg/dL   LDL Cholesterol NOT CALC <130 mg/dL      Assessment & Plan:   Encounter Diagnoses  Name Primary?  . Essential hypertension, benign Yes  . Hyperlipidemia   . Diabetes mellitus type 2, diet-controlled (HCC)   . Cigarette nicotine dependence without complication     -Increase fish oil to 3 daily -Get paperwork turned in so she can get on the welchol -counseled to maintain weight/diet in light of diet controlled diabetes -f/u 3 months.  RTO sooner prn

## 2016-05-22 ENCOUNTER — Other Ambulatory Visit: Payer: Self-pay | Admitting: Physician Assistant

## 2016-06-02 ENCOUNTER — Other Ambulatory Visit: Payer: Self-pay

## 2016-06-02 DIAGNOSIS — E119 Type 2 diabetes mellitus without complications: Secondary | ICD-10-CM

## 2016-06-02 DIAGNOSIS — I10 Essential (primary) hypertension: Secondary | ICD-10-CM

## 2016-06-02 DIAGNOSIS — E785 Hyperlipidemia, unspecified: Secondary | ICD-10-CM

## 2016-06-06 LAB — HEMOGLOBIN A1C
HEMOGLOBIN A1C: 6.2 % — AB (ref <5.7)
Mean Plasma Glucose: 131 mg/dL

## 2016-06-07 LAB — COMPLETE METABOLIC PANEL WITH GFR
ALBUMIN: 4.4 g/dL (ref 3.6–5.1)
ALK PHOS: 89 U/L (ref 33–130)
ALT: 11 U/L (ref 6–29)
AST: 16 U/L (ref 10–35)
BUN: 13 mg/dL (ref 7–25)
CALCIUM: 9.6 mg/dL (ref 8.6–10.4)
CHLORIDE: 101 mmol/L (ref 98–110)
CO2: 25 mmol/L (ref 20–31)
CREATININE: 0.83 mg/dL (ref 0.50–1.05)
GFR, Est African American: >89 mL/min (ref >=60)
GFR, Est Non African American: 80 mL/min (ref >=60)
Glucose, Bld: 99 mg/dL (ref 65–99)
Potassium: 4.7 mmol/L (ref 3.5–5.3)
Sodium: 137 mmol/L (ref 135–146)
TOTAL PROTEIN: 7.4 g/dL (ref 6.1–8.1)
Total Bilirubin: 0.5 mg/dL (ref 0.2–1.2)

## 2016-06-07 LAB — LIPID PANEL
CHOLESTEROL: 309 mg/dL — AB (ref <200)
HDL: 33 mg/dL — ABNORMAL LOW (ref >50)
Total CHOL/HDL Ratio: 9.4 Ratio — ABNORMAL HIGH (ref <5.0)
Triglycerides: 406 mg/dL — ABNORMAL HIGH (ref <150)

## 2016-06-10 ENCOUNTER — Ambulatory Visit: Payer: Self-pay | Admitting: Physician Assistant

## 2016-06-10 ENCOUNTER — Encounter: Payer: Self-pay | Admitting: Physician Assistant

## 2016-06-10 VITALS — BP 126/66 | HR 104 | Temp 97.9°F | Ht 66.0 in | Wt 167.2 lb

## 2016-06-10 DIAGNOSIS — E785 Hyperlipidemia, unspecified: Secondary | ICD-10-CM

## 2016-06-10 DIAGNOSIS — F17210 Nicotine dependence, cigarettes, uncomplicated: Secondary | ICD-10-CM

## 2016-06-10 DIAGNOSIS — I10 Essential (primary) hypertension: Secondary | ICD-10-CM

## 2016-06-10 DIAGNOSIS — E119 Type 2 diabetes mellitus without complications: Secondary | ICD-10-CM

## 2016-06-10 NOTE — Progress Notes (Signed)
BP 126/66 (BP Location: Left Arm, Patient Position: Sitting, Cuff Size: Normal)   Pulse (!) 104   Temp 97.9 F (36.6 C)   Ht 5\' 6"  (1.676 m)   Wt 167 lb 4 oz (75.9 kg)   SpO2 96%   BMI 26.99 kg/m    Subjective:    Patient ID: Faith Blair, female    DOB: 02/11/1961, 55 y.o.   MRN: [id number]  HPI: Faith Blair is a 55 y.o. female presenting on 06/10/2016 for Hyperlipidemia   HPI   Pt states her aunt was dx with lung cancer.  She says everything else is okay.  Pt just today brought in paperwork to get on the welchol  She stopped her flonase.  She is still smoking.   Relevant past medical, surgical, family and social history reviewed and updated as indicated. Interim medical history since our last visit reviewed. Allergies and medications reviewed and updated.   Current Outpatient Prescriptions:  .  Ibuprofen (ADVIL PO), Take 800 mg by mouth 3 (three) times daily as needed. , Disp: , Rfl:  .  lisinopril (PRINIVIL,ZESTRIL) 10 MG tablet, TAKE ONE TABLET BY MOUTH ONCE DAILY, Disp: 30 tablet, Rfl: 3 .  Omega-3 Fatty Acids (FISH OIL PO), Take 2 capsules by mouth daily. Reported on 01/07/2016, Disp: , Rfl:  .  colesevelam (WELCHOL) 625 MG tablet, Take 3 tablets (1,875 mg total) by mouth 2 (two) times daily with a meal. (Patient not taking: Reported on 06/10/2016), Disp: 540 tablet, Rfl: 1   Review of Systems  Constitutional: Positive for diaphoresis and fatigue. Negative for appetite change, chills, fever and unexpected weight change.  HENT: Positive for congestion and ear pain. Negative for dental problem, drooling, facial swelling, hearing loss, mouth sores, sneezing, sore throat, trouble swallowing and voice change.   Eyes: Negative for pain, discharge, redness, itching and visual disturbance.  Respiratory: Positive for chest tightness and wheezing. Negative for cough, choking and shortness of breath.   Cardiovascular: Negative for chest pain, palpitations and leg swelling.   Gastrointestinal: Negative for abdominal pain, blood in stool, constipation, diarrhea and vomiting.  Endocrine: Negative for cold intolerance, heat intolerance and polydipsia.  Genitourinary: Negative for decreased urine volume, dysuria and hematuria.  Musculoskeletal: Positive for arthralgias and back pain. Negative for gait problem.  Skin: Negative for rash.  Allergic/Immunologic: Positive for environmental allergies.  Neurological: Negative for seizures, syncope, light-headedness and headaches.  Hematological: Negative for adenopathy.  Psychiatric/Behavioral: Negative for agitation, dysphoric mood and suicidal ideas. The patient is nervous/anxious.     Per HPI unless specifically indicated above     Objective:    BP 126/66 (BP Location: Left Arm, Patient Position: Sitting, Cuff Size: Normal)   Pulse (!) 104   Temp 97.9 F (36.6 C)   Ht 5\' 6"  (1.676 m)   Wt 167 lb 4 oz (75.9 kg)   SpO2 96%   BMI 26.99 kg/m   Wt Readings from Last 3 Encounters:  06/10/16 167 lb 4 oz (75.9 kg)  03/11/16 163 lb 12.8 oz (74.3 kg)  01/07/16 162 lb 9.6 oz (73.8 kg)    Physical Exam  Constitutional: She is oriented to person, place, and time. She appears well-developed and well-nourished.  HENT:  Head: Normocephalic and atraumatic.  Right Ear: Hearing, tympanic membrane, external ear and ear canal normal.  Left Ear: Hearing, tympanic membrane, external ear and ear canal normal.  Nose: Mucosal edema and rhinorrhea present.  Mouth/Throat: Uvula is midline and oropharynx is clear and moist.  No oropharyngeal exudate.  Neck: Neck supple.  Cardiovascular: Normal rate and regular rhythm.   Pulmonary/Chest: Effort normal and breath sounds normal. She has no wheezes.  Abdominal: Soft. Bowel sounds are normal. She exhibits no mass. There is no hepatosplenomegaly. There is no tenderness.  Musculoskeletal: She exhibits no edema.  Lymphadenopathy:    She has no cervical adenopathy.  Neurological: She is  alert and oriented to person, place, and time.  Skin: Skin is warm and dry.  Psychiatric: She has a normal mood and affect. Her behavior is normal.  Vitals reviewed.   Results for orders placed or performed in visit on 06/02/16  HgB A1c  Result Value Ref Range   Hgb A1c MFr Bld 6.2 (H) <5.7 %   Mean Plasma Glucose 131 mg/dL  Lipid Profile  Result Value Ref Range   Cholesterol 309 (H) <200 mg/dL   Triglycerides 409406 (H) <150 mg/dL   HDL 33 (L) >81>50 mg/dL   Total CHOL/HDL Ratio 9.4 (H) <5.0 Ratio   VLDL NOT CALC <30 mg/dL   LDL Cholesterol NOT CALC <100 mg/dL  COMPLETE METABOLIC PANEL WITH GFR  Result Value Ref Range   Sodium 137 135 - 146 mmol/L   Potassium 4.7 3.5 - 5.3 mmol/L   Chloride 101 98 - 110 mmol/L   CO2 25 20 - 31 mmol/L   Glucose, Bld 99 65 - 99 mg/dL   BUN 13 7 - 25 mg/dL   Creat 1.910.83 4.780.50 - 2.951.05 mg/dL   Total Bilirubin 0.5 0.2 - 1.2 mg/dL   Alkaline Phosphatase 89 33 - 130 U/L   AST 16 10 - 35 U/L   ALT 11 6 - 29 U/L   Total Protein 7.4 6.1 - 8.1 g/dL   Albumin 4.4 3.6 - 5.1 g/dL   Calcium 9.6 8.6 - 62.110.4 mg/dL   GFR, Est African American >89 >=60 mL/min   GFR, Est Non African American 80 >=60 mL/min      Assessment & Plan:   Encounter Diagnoses  Name Primary?  . Essential hypertension, benign Yes  . Hyperlipidemia, unspecified hyperlipidemia type   . Diabetes mellitus type 2, diet-controlled (HCC)   . Cigarette nicotine dependence without complication     -reviewed labs with pt -Increase fish oil to 4 daily -pt to get on welchol soon since she brough in her financials today -recommended Humidifier, saline nasal spray to help with nasal dryness -pt states she doesn't want to stop smoking at this time -F/u 3 months.  RTO sooner prn

## 2016-09-01 ENCOUNTER — Other Ambulatory Visit: Payer: Self-pay

## 2016-09-01 DIAGNOSIS — E119 Type 2 diabetes mellitus without complications: Secondary | ICD-10-CM

## 2016-09-01 DIAGNOSIS — I10 Essential (primary) hypertension: Secondary | ICD-10-CM

## 2016-09-01 DIAGNOSIS — E785 Hyperlipidemia, unspecified: Secondary | ICD-10-CM

## 2016-09-05 LAB — COMPREHENSIVE METABOLIC PANEL
ALBUMIN: 4.3 g/dL (ref 3.6–5.1)
ALT: 12 U/L (ref 6–29)
AST: 14 U/L (ref 10–35)
Alkaline Phosphatase: 99 U/L (ref 33–130)
BUN: 12 mg/dL (ref 7–25)
CHLORIDE: 105 mmol/L (ref 98–110)
CO2: 25 mmol/L (ref 20–31)
Calcium: 9.6 mg/dL (ref 8.6–10.4)
Creat: 0.93 mg/dL (ref 0.50–1.05)
GLUCOSE: 98 mg/dL (ref 65–99)
Potassium: 4.7 mmol/L (ref 3.5–5.3)
Sodium: 136 mmol/L (ref 135–146)
Total Bilirubin: 0.4 mg/dL (ref 0.2–1.2)
Total Protein: 7.1 g/dL (ref 6.1–8.1)

## 2016-09-05 LAB — LIPID PANEL
Cholesterol: 270 mg/dL — ABNORMAL HIGH (ref <200)
HDL: 30 mg/dL — ABNORMAL LOW (ref >50)
LDL CALC: 177 mg/dL — AB (ref <100)
TRIGLYCERIDES: 313 mg/dL — AB (ref <150)
Total CHOL/HDL Ratio: 9 Ratio — ABNORMAL HIGH (ref <5.0)
VLDL: 63 mg/dL — AB (ref <30)

## 2016-09-06 LAB — HEMOGLOBIN A1C
Hgb A1c MFr Bld: 6.1 % — ABNORMAL HIGH (ref <5.7)
Mean Plasma Glucose: 128 mg/dL

## 2016-09-10 ENCOUNTER — Encounter: Payer: Self-pay | Admitting: Physician Assistant

## 2016-09-10 ENCOUNTER — Ambulatory Visit: Payer: Self-pay | Admitting: Physician Assistant

## 2016-09-10 VITALS — BP 114/76 | HR 82 | Temp 98.1°F | Ht 66.0 in | Wt 168.5 lb

## 2016-09-10 DIAGNOSIS — F17210 Nicotine dependence, cigarettes, uncomplicated: Secondary | ICD-10-CM

## 2016-09-10 DIAGNOSIS — E785 Hyperlipidemia, unspecified: Secondary | ICD-10-CM

## 2016-09-10 DIAGNOSIS — I10 Essential (primary) hypertension: Secondary | ICD-10-CM

## 2016-09-10 DIAGNOSIS — F419 Anxiety disorder, unspecified: Secondary | ICD-10-CM

## 2016-09-10 DIAGNOSIS — E119 Type 2 diabetes mellitus without complications: Secondary | ICD-10-CM

## 2016-09-10 NOTE — Progress Notes (Signed)
BP 114/76 (BP Location: Left Arm, Patient Position: Sitting, Cuff Size: Normal)   Pulse 82   Temp 98.1 F (36.7 C)   Ht 5\' 6"  (1.676 m)   Wt 168 lb 8 oz (76.4 kg)   SpO2 96%   BMI 27.20 kg/m    Subjective:    Patient ID: Faith Blair, female    DOB: Jan 03, 1961, 56 y.o.   MRN: [id number]  HPI: Faith Blair is a 55 y.o. female presenting on 09/10/2016 for Hyperlipidemia; Diabetes; and Hypertension   HPI   Pt Still smoking  Pt still reports Lots of anxiety.  States she doesn't need counseling.  Says she knows what she needs (ie money, more time, etc)  No new concerns today   Relevant past medical, surgical, family and social history reviewed and updated as indicated. Interim medical history since our last visit reviewed. Allergies and medications reviewed and updated.   Current Outpatient Prescriptions:  .  ASTAXANTHIN PO, Take 12 mg by mouth daily., Disp: , Rfl:  .  Cholecalciferol 5000 units TABS, Take 1 tablet by mouth daily., Disp: , Rfl:  .  colesevelam (WELCHOL) 625 MG tablet, Take 3 tablets (1,875 mg total) by mouth 2 (two) times daily with a meal., Disp: 540 tablet, Rfl: 1 .  Fluticasone Propionate (FLONASE ALLERGY RELIEF NA), Place 2 sprays into the nose daily. Into in each nostril, Disp: , Rfl:  .  Ibuprofen (ADVIL PO), Take 800 mg by mouth 3 (three) times daily as needed. , Disp: , Rfl:  .  lisinopril (PRINIVIL,ZESTRIL) 10 MG tablet, TAKE ONE TABLET BY MOUTH ONCE DAILY, Disp: 30 tablet, Rfl: 3 .  magnesium oxide (MAG-OX) 400 MG tablet, Take 400 mg by mouth daily., Disp: , Rfl:  .  Omega-3 Fatty Acids (FISH OIL PO), Take 2 capsules by mouth daily. Reported on 01/07/2016, Disp: , Rfl:  .  SPIRULINA PO, Take 3,000 mg by mouth daily., Disp: , Rfl:    Review of Systems  Constitutional: Negative for appetite change, chills, diaphoresis, fatigue, fever and unexpected weight change.  HENT: Positive for sneezing. Negative for congestion, dental problem, drooling, ear pain,  facial swelling, hearing loss, mouth sores, sore throat, trouble swallowing and voice change.   Eyes: Positive for itching. Negative for pain, discharge, redness and visual disturbance.  Respiratory: Negative for cough, choking, shortness of breath and wheezing.   Cardiovascular: Positive for palpitations. Negative for chest pain and leg swelling.  Gastrointestinal: Negative for abdominal pain, blood in stool, constipation, diarrhea and vomiting.  Endocrine: Positive for heat intolerance. Negative for cold intolerance and polydipsia.  Genitourinary: Negative for decreased urine volume, dysuria and hematuria.  Musculoskeletal: Positive for arthralgias and back pain. Negative for gait problem.  Skin: Negative for rash.  Allergic/Immunologic: Positive for environmental allergies.  Neurological: Negative for seizures, syncope, light-headedness and headaches.  Hematological: Negative for adenopathy.  Psychiatric/Behavioral: Negative for agitation, dysphoric mood and suicidal ideas. The patient is nervous/anxious.     Per HPI unless specifically indicated above     Objective:    BP 114/76 (BP Location: Left Arm, Patient Position: Sitting, Cuff Size: Normal)   Pulse 82   Temp 98.1 F (36.7 C)   Ht 5\' 6"  (1.676 m)   Wt 168 lb 8 oz (76.4 kg)   SpO2 96%   BMI 27.20 kg/m   Wt Readings from Last 3 Encounters:  09/10/16 168 lb 8 oz (76.4 kg)  06/10/16 167 lb 4 oz (75.9 kg)  03/11/16 163 lb 12.8  oz (74.3 kg)    Physical Exam  Constitutional: She is oriented to person, place, and time. She appears well-developed and well-nourished.  HENT:  Head: Normocephalic and atraumatic.  Neck: Neck supple.  Cardiovascular: Normal rate and regular rhythm.   Pulmonary/Chest: Effort normal and breath sounds normal.  Abdominal: Soft. Bowel sounds are normal. She exhibits no mass. There is no hepatosplenomegaly. There is no tenderness.  Musculoskeletal: She exhibits no edema.  Lymphadenopathy:    She has  no cervical adenopathy.  Neurological: She is alert and oriented to person, place, and time.  Skin: Skin is warm and dry.  Psychiatric: She has a normal mood and affect. Her behavior is normal.  Vitals reviewed.   Results for orders placed or performed in visit on 09/01/16  HgB A1c  Result Value Ref Range   Hgb A1c MFr Bld 6.1 (H) <5.7 %   Mean Plasma Glucose 128 mg/dL  Comprehensive metabolic panel  Result Value Ref Range   Sodium 136 135 - 146 mmol/L   Potassium 4.7 3.5 - 5.3 mmol/L   Chloride 105 98 - 110 mmol/L   CO2 25 20 - 31 mmol/L   Glucose, Bld 98 65 - 99 mg/dL   BUN 12 7 - 25 mg/dL   Creat 1.610.93 0.960.50 - 0.451.05 mg/dL   Total Bilirubin 0.4 0.2 - 1.2 mg/dL   Alkaline Phosphatase 99 33 - 130 U/L   AST 14 10 - 35 U/L   ALT 12 6 - 29 U/L   Total Protein 7.1 6.1 - 8.1 g/dL   Albumin 4.3 3.6 - 5.1 g/dL   Calcium 9.6 8.6 - 40.910.4 mg/dL  Lipid Profile  Result Value Ref Range   Cholesterol 270 (H) <200 mg/dL   Triglycerides 811313 (H) <150 mg/dL   HDL 30 (L) >91>50 mg/dL   Total CHOL/HDL Ratio 9.0 (H) <5.0 Ratio   VLDL 63 (H) <30 mg/dL   LDL Cholesterol 478177 (H) <100 mg/dL      Assessment & Plan:   Encounter Diagnoses  Name Primary?  . Diabetes mellitus type 2, diet-controlled (HCC) Yes  . Hyperlipidemia, unspecified hyperlipidemia type   . Essential hypertension, benign   . Cigarette nicotine dependence without complication   . Anxiety     -reviewed labs with pt -pt to Increase to 4 fish oil daily -Pt intoleratant to statins.  Discussed newer options for cholesterol like the injectibles.  Pt is not interested in seeing a specialist to discuss at the current tie -Pt doesn't want mammogram at this time -Reminded pt to return iFOBT that was given to her 11-24-2015 -counseled smoking cessation -pt to follow up  3months.  RTO sooner prn

## 2016-09-21 ENCOUNTER — Other Ambulatory Visit: Payer: Self-pay | Admitting: Physician Assistant

## 2016-10-27 ENCOUNTER — Ambulatory Visit: Payer: Self-pay | Admitting: Physician Assistant

## 2016-10-27 ENCOUNTER — Encounter: Payer: Self-pay | Admitting: Physician Assistant

## 2016-10-27 VITALS — BP 124/78 | HR 98 | Temp 98.2°F | Ht 66.0 in | Wt 166.0 lb

## 2016-10-27 DIAGNOSIS — F329 Major depressive disorder, single episode, unspecified: Secondary | ICD-10-CM

## 2016-10-27 DIAGNOSIS — F419 Anxiety disorder, unspecified: Secondary | ICD-10-CM

## 2016-10-27 DIAGNOSIS — F32A Depression, unspecified: Secondary | ICD-10-CM

## 2016-10-27 DIAGNOSIS — R002 Palpitations: Secondary | ICD-10-CM

## 2016-10-27 LAB — CBC
HEMATOCRIT: 41.1 % (ref 35.0–45.0)
HEMOGLOBIN: 13.8 g/dL (ref 11.7–15.5)
MCH: 30.1 pg (ref 27.0–33.0)
MCHC: 33.6 g/dL (ref 32.0–36.0)
MCV: 89.5 fL (ref 80.0–100.0)
MPV: 10 fL (ref 7.5–12.5)
Platelets: 255 10*3/uL (ref 140–400)
RBC: 4.59 MIL/uL (ref 3.80–5.10)
RDW: 13.8 % (ref 11.0–15.0)
WBC: 9.7 10*3/uL (ref 3.8–10.8)

## 2016-10-27 MED ORDER — LISINOPRIL 10 MG PO TABS: 10.0000 mg | ORAL_TABLET | Freq: Every day | ORAL | 1 refills | Status: DC

## 2016-10-27 NOTE — Progress Notes (Signed)
BP 124/78 (BP Location: Left Arm, Patient Position: Sitting, Cuff Size: Normal)   Pulse 98   Temp 98.2 F (36.8 C)   Ht  (1.676 m)   Wt 166 lb (75.3 kg)   SpO2 98%   BMI 26.79 kg/m    Subjective:    Patient ID: Faith Blair, female    DOB: 17-Nov-1960, 56 y.o.   MRN: [id number]  HPI: Faith Blair is a 56 y.o. female presenting on 10/27/2016 for Palpitations (rapid heart rate some chest pain maybe a week ago on left side. pt states feeling sob and dizziness. pt states took liquid magnesium and feels it helped)   HPI   Chief Complaint  Patient presents with  . Palpitations    rapid heart rate some chest pain maybe a week ago on left side. pt states feeling sob and dizziness. pt states took liquid magnesium and feels it helped    Pt States the rapid heart rate happened twice last week.  It started about 4 wk ago and it was only about once/week until last week was more.    She drinks 2 coffee each am.  No soda or tea.  Only occassional chocolate.  She started OTC gaba recently after the palpitations started.  No other recent OTC meds started.  Pt states still a lot of anxiety.  She was On cymbalta in past and she hated it.  She says she just doesn't have money and it is causing her a great deal of stress.  She says she doesn't have enough food to feed her husband (who has medical problems).  She says she asks family for help but they are becoming less inclined to provide assistance.  Relevant past medical, surgical, family and social history reviewed and updated as indicated. Interim medical history since our last visit reviewed. Allergies and medications reviewed and updated.   Current Outpatient Prescriptions:  .  ASTAXANTHIN PO, Take 12 mg by mouth daily., Disp: , Rfl:  .  Cholecalciferol 5000 units TABS, Take 1 tablet by mouth daily., Disp: , Rfl:  .  Fluticasone Propionate (FLONASE ALLERGY RELIEF NA), Place 2 sprays into the nose daily. Into in each nostril, Disp: , Rfl:   .  GAMMA AMINOBUTYRIC ACID PO, Take by mouth., Disp: , Rfl:  .  Ibuprofen (ADVIL PO), Take 400 mg by mouth 3 (three) times daily as needed. , Disp: , Rfl:  .  lisinopril (PRINIVIL,ZESTRIL) 10 MG tablet, TAKE ONE TABLET BY MOUTH ONCE DAILY, Disp: 30 tablet, Rfl: 3 .  magnesium oxide (MAG-OX) 400 MG tablet, Take 400 mg by mouth daily., Disp: , Rfl:  .  Omega-3 Fatty Acids (FISH OIL PO), Take 2 capsules by mouth daily. Reported on 01/07/2016, Disp: , Rfl:  .  colesevelam (WELCHOL) 625 MG tablet, Take 3 tablets (1,875 mg total) by mouth 2 (two) times daily with a meal. (Patient not taking: Reported on 10/27/2016), Disp: 540 tablet, Rfl: 1   Review of Systems  Constitutional: Positive for diaphoresis. Negative for appetite change, chills, fatigue, fever and unexpected weight change.  HENT: Positive for congestion, dental problem and sneezing. Negative for drooling, ear pain, facial swelling, hearing loss, mouth sores, sore throat, trouble swallowing and voice change.   Eyes: Negative for pain, discharge, redness, itching and visual disturbance.  Respiratory: Positive for shortness of breath. Negative for cough, choking and wheezing.   Cardiovascular: Positive for chest pain and palpitations. Negative for leg swelling.  Gastrointestinal: Negative for abdominal pain, blood in  stool, constipation, diarrhea and vomiting.  Endocrine: Positive for heat intolerance. Negative for cold intolerance and polydipsia.  Genitourinary: Negative for decreased urine volume, dysuria and hematuria.  Musculoskeletal: Positive for arthralgias and back pain. Negative for gait problem.  Skin: Negative for rash.  Allergic/Immunologic: Positive for environmental allergies.  Neurological: Positive for light-headedness. Negative for seizures, syncope and headaches.  Hematological: Negative for adenopathy.  Psychiatric/Behavioral: Positive for agitation and dysphoric mood. Negative for suicidal ideas. The patient is  nervous/anxious.     Per HPI unless specifically indicated above     Objective:    BP 124/78 (BP Location: Left Arm, Patient Position: Sitting, Cuff Size: Normal)   Pulse 98   Temp 98.2 F (36.8 C)   Ht  (1.676 m)   Wt 166 lb (75.3 kg)   SpO2 98%   BMI 26.79 kg/m   Wt Readings from Last 3 Encounters:  10/27/16 166 lb (75.3 kg)  09/10/16 168 lb 8 oz (76.4 kg)  06/10/16 167 lb 4 oz (75.9 kg)    Physical Exam  Constitutional: She is oriented to person, place, and time. She appears well-developed and well-nourished.  HENT:  Head: Normocephalic and atraumatic.  Neck: Neck supple.  Cardiovascular: Normal rate, regular rhythm, normal heart sounds and intact distal pulses.   Pulmonary/Chest: Effort normal and breath sounds normal. No respiratory distress. She has no wheezes. She has no rales.  Abdominal: Soft. Bowel sounds are normal. She exhibits no mass. There is no hepatosplenomegaly. There is no tenderness.  Musculoskeletal: She exhibits no edema.  Lymphadenopathy:    She has no cervical adenopathy.  Neurological: She is alert and oriented to person, place, and time.  Skin: Skin is warm and dry.  Psychiatric: Her speech is normal and behavior is normal. Thought content normal. Her mood appears anxious. Cognition and memory are normal. She exhibits a depressed mood.  Pt became tearful when discussing her financial problems.  Nursing note and vitals reviewed.     EKG- NSR at 73 bpm. No st-t changes. No previous for comparison      Assessment & Plan:   Encounter Diagnoses  Name Primary?  Marland Kitchen Anxiety Yes  . Depression, unspecified depression type   . Heart palpitations      -Urged pt to get counseling- gave [facility] walk[facility]in information and [facility] card to call for referral if she prefers to be seen at another facility -Check labs today in light of complaints  -Gave contact information for [facility] to get help with food -recommended pt avoid coffee,  caffeine -recommended pt stop her OTC supplements as they have not be shown to be beneficial, may be harmful and are certainly more expensive than she can afford -gave samples welchol and reordered -pt to follow up next month as scheduled.  Encouraged pt to RTO for worsening or new symptoms

## 2016-10-27 NOTE — Patient Instructions (Signed)
welchol- 3 tablets twice daily with meal

## 2016-10-28 LAB — BASIC METABOLIC PANEL
BUN: 15 mg/dL (ref 7–25)
CHLORIDE: 100 mmol/L (ref 98–110)
CO2: 27 mmol/L (ref 20–31)
Calcium: 9.7 mg/dL (ref 8.6–10.4)
Creat: 0.81 mg/dL (ref 0.50–1.05)
GLUCOSE: 102 mg/dL — AB (ref 65–99)
POTASSIUM: 4.6 mmol/L (ref 3.5–5.3)
Sodium: 139 mmol/L (ref 135–146)

## 2016-12-04 ENCOUNTER — Other Ambulatory Visit (HOSPITAL_COMMUNITY)
Admission: RE | Admit: 2016-12-04 | Discharge: 2016-12-04 | Disposition: A | Discharge status: Home / Self Care | Payer: Self-pay | Source: Ambulatory Visit | Attending: Physician Assistant | Admitting: Physician Assistant

## 2016-12-04 LAB — COMPREHENSIVE METABOLIC PANEL
ALBUMIN: 4.2 g/dL (ref 3.5–5.0)
ALT: 21 U/L (ref 14–54)
ANION GAP: 7 (ref 5–15)
AST: 19 U/L (ref 15–41)
Alkaline Phosphatase: 100 U/L (ref 38–126)
BILIRUBIN TOTAL: 0.7 mg/dL (ref 0.3–1.2)
BUN: 13 mg/dL (ref 6–20)
CO2: 27 mmol/L (ref 22–32)
Calcium: 9.6 mg/dL (ref 8.9–10.3)
Chloride: 102 mmol/L (ref 101–111)
Creatinine, Ser: 0.67 mg/dL (ref 0.44–1.00)
GFR calc Af Amer: >60 mL/min (ref >60)
GFR calc non Af Amer: >60 mL/min (ref >60)
GLUCOSE: 93 mg/dL (ref 65–99)
POTASSIUM: 4.2 mmol/L (ref 3.5–5.1)
Sodium: 136 mmol/L (ref 135–145)
TOTAL PROTEIN: 7.6 g/dL (ref 6.5–8.1)

## 2016-12-04 LAB — LIPID PANEL
CHOL/HDL RATIO: 8.5 ratio
CHOLESTEROL: 280 mg/dL — AB (ref 0–200)
HDL: 33 mg/dL — AB (ref >40)
LDL Cholesterol: UNDETERMINED mg/dL (ref 0–99)
Triglycerides: 447 mg/dL — ABNORMAL HIGH (ref <150)
VLDL: UNDETERMINED mg/dL (ref 0–40)

## 2016-12-05 LAB — HEMOGLOBIN A1C
Hgb A1c MFr Bld: 6.5 % — ABNORMAL HIGH (ref 4.8–5.6)
Mean Plasma Glucose: 140 mg/dL

## 2016-12-05 LAB — MICROALBUMIN, URINE: Microalb, Ur: <3 ug/mL — ABNORMAL HIGH

## 2016-12-09 ENCOUNTER — Ambulatory Visit: Payer: Self-pay | Admitting: Physician Assistant

## 2016-12-09 ENCOUNTER — Encounter: Payer: Self-pay | Admitting: Physician Assistant

## 2016-12-09 VITALS — BP 164/90 | HR 99 | Temp 97.7°F | Ht 66.0 in | Wt 166.0 lb

## 2016-12-09 DIAGNOSIS — E785 Hyperlipidemia, unspecified: Secondary | ICD-10-CM

## 2016-12-09 DIAGNOSIS — F329 Major depressive disorder, single episode, unspecified: Secondary | ICD-10-CM

## 2016-12-09 DIAGNOSIS — F17210 Nicotine dependence, cigarettes, uncomplicated: Secondary | ICD-10-CM

## 2016-12-09 DIAGNOSIS — F32A Depression, unspecified: Secondary | ICD-10-CM

## 2016-12-09 DIAGNOSIS — I10 Essential (primary) hypertension: Secondary | ICD-10-CM

## 2016-12-09 DIAGNOSIS — E119 Type 2 diabetes mellitus without complications: Secondary | ICD-10-CM

## 2016-12-09 DIAGNOSIS — M542 Cervicalgia: Secondary | ICD-10-CM

## 2016-12-09 DIAGNOSIS — M79642 Pain in left hand: Secondary | ICD-10-CM

## 2016-12-09 MED ORDER — GLIPIZIDE 5 MG PO TABS: 5.0000 mg | ORAL_TABLET | Freq: Every day | ORAL | 3 refills | Status: DC

## 2016-12-09 MED ORDER — DICLOFENAC SODIUM 75 MG PO TBEC: 75.0000 mg | DELAYED_RELEASE_TABLET | Freq: Two times a day (BID) | ORAL | 0 refills | Status: DC

## 2016-12-09 NOTE — Patient Instructions (Addendum)
Ice to hand 10-20 minutes 3-4 times daily.   Turn in [facility] applicatoin  Xray neck  Glipizide for diabetes  Get back on fish oil if possible.   Diclofenac for hand (do not use ibuprofen)

## 2016-12-09 NOTE — Progress Notes (Signed)
BP (!) 164/90 (BP Location: Left Arm, Patient Position: Sitting, Cuff Size: Normal)   Pulse 99   Temp 97.7 F (36.5 C)   Ht 5\' 6"  (1.676 m)   Wt 166 lb (75.3 kg)   SpO2 96%   BMI 26.79 kg/m    Subjective:    Patient ID: Faith Blair, female    DOB: 03/21/1961, 56 y.o.   MRN: [id number]  HPI: Faith Blair is a 56 y.o. female presenting on 12/09/2016 for Hyperlipidemia; Diabetes; and Hypertension   HPI   [facility].  She is not sure if it is helping or not.   Pt c/o L hand pain.   Pt also c/o neck pain.   Pt s/[facility] neck surge[facility].  Hand hurting couple of months.   States neck hurting for years. Pt states history neck surgery  Reviewed labs with pt . Discussed metformin. She says she was on it in the past and Pt states it gave bad GI side effects.    Relevant past medical, surgical, family and social history reviewed and updated as indicated. Interim medical history since our last visit reviewed. Allergies and medications reviewed and updated.   Current Outpatient Prescriptions:  .  colesevelam (WELCHOL) 625 MG tablet, Take 3 tablets (1,875 mg total) by mouth 2 (two) times daily with a meal., Disp: 540 tablet, Rfl: 1 .  Fluticasone Propionate (FLONASE ALLERGY RELIEF NA), Place 2 sprays into the nose daily. Into in each nostril, Disp: , Rfl:  .  Ibuprofen (ADVIL PO), Take 600 mg by mouth 3 (three) times daily as needed. , Disp: , Rfl:  .  lisinopril (PRINIVIL,ZESTRIL) 10 MG tablet, Take 1 tablet (10 mg total) by mouth daily., Disp: 90 tablet, Rfl: 1 .  loratadine (CLARITIN) 10 MG tablet, Take 10 mg by mouth daily as needed for allergies., Disp: , Rfl:  .  magnesium oxide (MAG-OX) 400 MG tablet, Take 400 mg by mouth daily., Disp: , Rfl:  .  ASTAXANTHIN PO, Take 12 mg by mouth daily., Disp: , Rfl:  .  Cholecalciferol 5000 units TABS, Take 1 tablet by mouth daily., Disp: , Rfl:  .  GAMMA AMINOBUTYRIC ACID PO, Take by mouth., Disp: , Rfl:  .   Omega-3 Fatty Acids (FISH OIL PO), Take 2 capsules by mouth daily. Reported on 01/07/2016, Disp: , Rfl:    Review of Systems  Constitutional: Positive for diaphoresis and fatigue. Negative for appetite change, chills, fever and unexpected weight change.  HENT: Positive for congestion and sneezing. Negative for dental problem, drooling, ear pain, facial swelling, hearing loss, mouth sores, sore throat, trouble swallowing and voice change.   Eyes: Positive for itching. Negative for pain, discharge, redness and visual disturbance.  Respiratory: Positive for shortness of breath and wheezing. Negative for cough and choking.   Cardiovascular: Positive for palpitations. Negative for chest pain and leg swelling.  Gastrointestinal: Negative for abdominal pain, blood in stool, constipation, diarrhea and vomiting.  Endocrine: Positive for heat intolerance. Negative for cold intolerance and polydipsia.  Genitourinary: Negative for decreased urine volume, dysuria and hematuria.  Musculoskeletal: Positive for arthralgias and back pain. Negative for gait problem.  Skin: Negative for rash.  Allergic/Immunologic: Positive for environmental allergies.  Neurological: Positive for headaches. Negative for seizures, syncope and light-headedness.  Hematological: Negative for adenopathy.  Psychiatric/Behavioral: Positive for agitation. Negative for dysphoric mood and suicidal ideas. The patient is nervous/anxious.     Per HPI unless specifically indicated  above     Objective:    BP (!) 164/90 (BP Location: Left Arm, Patient Position: Sitting, Cuff Size: Normal)   Pulse 99   Temp 97.7 F (36.5 C)   Ht 5\' 6"  (1.676 m)   Wt 166 lb (75.3 kg)   SpO2 96%   BMI 26.79 kg/m   Wt Readings from Last 3 Encounters:  12/09/16 166 lb (75.3 kg)  10/27/16 166 lb (75.3 kg)  09/10/16 168 lb 8 oz (76.4 kg)    Physical Exam  Constitutional: She is oriented to person, place, and time. She appears well-developed and  well-nourished.  HENT:  Head: Normocephalic and atraumatic.  Neck: Neck supple.  Cardiovascular: Normal rate and regular rhythm.   Pulmonary/Chest: Effort normal and breath sounds normal.  Abdominal: Soft. Bowel sounds are normal. She exhibits no mass. There is no hepatosplenomegaly. There is no tenderness.  Musculoskeletal: She exhibits no edema.       Cervical back: She exhibits tenderness. She exhibits normal range of motion, no bony tenderness, no swelling, no edema and no deformity.       Left hand: She exhibits tenderness and swelling. She exhibits normal range of motion, no bony tenderness and normal capillary refill. Normal sensation noted.  Some swelling thenar eminence.  Some tenderness proximal 1st metacarpal.  Lymphadenopathy:    She has no cervical adenopathy.  Neurological: She is alert and oriented to person, place, and time.  Skin: Skin is warm and dry.  Psychiatric: Her speech is normal and behavior is normal. She exhibits a depressed mood.  Vitals reviewed.   Results for orders placed or performed during the hospital encounter of 12/04/16  Comprehensive metabolic panel  Result Value Ref Range   Sodium 136 135 - 145 mmol/L   Potassium 4.2 3.5 - 5.1 mmol/L   Chloride 102 101 - 111 mmol/L   CO2 27 22 - 32 mmol/L   Glucose, Bld 93 65 - 99 mg/dL   BUN 13 6 - 20 mg/dL   Creatinine, Ser 1.61 0.44 - 1.00 mg/dL   Calcium 9.6 8.9 - 09.6 mg/dL   Total Protein 7.6 6.5 - 8.1 g/dL   Albumin 4.2 3.5 - 5.0 g/dL   AST 19 15 - 41 U/L   ALT 21 14 - 54 U/L   Alkaline Phosphatase 100 38 - 126 U/L   Total Bilirubin 0.7 0.3 - 1.2 mg/dL   GFR calc non Af Amer >60 >60 mL/min   GFR calc Af Amer >60 >60 mL/min   Anion gap 7 5 - 15  Lipid panel  Result Value Ref Range   Cholesterol 280 (H) 0 - 200 mg/dL   Triglycerides 045 (H) <150 mg/dL   HDL 33 (L) >40 mg/dL   Total CHOL/HDL Ratio 8.5 RATIO   VLDL UNABLE TO CALCULATE IF TRIGLYCERIDE OVER 400 mg/dL 0 - 40 mg/dL   LDL Cholesterol  UNABLE TO CALCULATE IF TRIGLYCERIDE OVER 400 mg/dL 0 - 99 mg/dL  Hemoglobin J8J  Result Value Ref Range   Hgb A1c MFr Bld 6.5 (H) 4.8 - 5.6 %   Mean Plasma Glucose 140 mg/dL  Microalbumin, urine  Result Value Ref Range   Microalb, Ur <3.0 (H) Not Estab. ug/mL      Assessment & Plan:   Encounter Diagnoses  Name Primary?  . Type 2 diabetes mellitus without complication, unspecified whether long term insulin use (HCC) Yes  . Neck pain   . Hyperlipidemia, unspecified hyperlipidemia type   . Depression, unspecified depression  type   . Essential hypertension, benign   . Cigarette nicotine dependence without complication   . Left hand pain     -will order Xray neck -pt to Ice hand/thumb area.  rx diclofenac -pt given [facility] discount application -will refer to orthopedics for neck and hand when approved for discount -Pt agrees to glipizide  And diabetic diet -pt to get back on fish oil when she can afford to get it -pt to continue with [facility] for depression/counseling -no changes to bp medication today.  Will recheck at follow up appt -will have pt follow up in 1 month. RTO sooner prn

## 2016-12-18 ENCOUNTER — Ambulatory Visit (HOSPITAL_COMMUNITY)
Admission: RE | Admit: 2016-12-18 | Discharge: 2016-12-18 | Disposition: A | Discharge status: Home / Self Care | Payer: Self-pay | Source: Ambulatory Visit | Attending: Physician Assistant | Admitting: Physician Assistant

## 2016-12-18 DIAGNOSIS — M50322 Other cervical disc degeneration at C5-C6 level: Secondary | ICD-10-CM | POA: Insufficient documentation

## 2017-01-13 ENCOUNTER — Ambulatory Visit: Payer: Self-pay | Admitting: Physician Assistant

## 2017-01-13 ENCOUNTER — Encounter: Payer: Self-pay | Admitting: Physician Assistant

## 2017-01-13 VITALS — BP 140/80 | HR 101 | Temp 97.7°F | Ht 66.0 in | Wt 165.0 lb

## 2017-01-13 DIAGNOSIS — M542 Cervicalgia: Secondary | ICD-10-CM

## 2017-01-13 DIAGNOSIS — F17210 Nicotine dependence, cigarettes, uncomplicated: Secondary | ICD-10-CM

## 2017-01-13 DIAGNOSIS — F329 Major depressive disorder, single episode, unspecified: Secondary | ICD-10-CM

## 2017-01-13 DIAGNOSIS — I10 Essential (primary) hypertension: Secondary | ICD-10-CM

## 2017-01-13 DIAGNOSIS — E119 Type 2 diabetes mellitus without complications: Secondary | ICD-10-CM

## 2017-01-13 DIAGNOSIS — E785 Hyperlipidemia, unspecified: Secondary | ICD-10-CM

## 2017-01-13 DIAGNOSIS — F32A Depression, unspecified: Secondary | ICD-10-CM

## 2017-01-13 NOTE — Patient Instructions (Signed)
Financial counselor- [phone]   

## 2017-01-13 NOTE — Progress Notes (Signed)
BP 140/80 (BP Location: Left Arm, Patient Position: Sitting, Cuff Size: Normal)   Pulse (!) 101   Temp 97.7 F (36.5 C)   Ht 5\' 6"  (1.676 m)   Wt 165 lb (74.8 kg)   SpO2 95%   BMI 26.63 kg/m    Subjective:    Patient ID: Faith Blair, female    DOB: November 05, 1960, 56 y.o.   MRN: [id number]  HPI: Faith Blair is a 56 y.o. female presenting on 01/13/2017 for Diabetes (pt states not taking glipizide. pt is taking berberine instead.); Neck Pain; Hand Pain; Hypertension; and Mental Health Problem   HPI     -Pt hand still hurting -Pt did not turn in her [employer] discount application- pt to get referred to orthopedist when she gets it -Pt not taking the glipizide she was prescribed for her diabetes. She wants to taker berberine instead -pt still [facility] -pt still not back on fish oil but planning to when she can afford it -bp improved from last OV but still too high.  Pt says her bp is normal at home when she checks it.  She doesn't want to take more medication for it even when we discussed risks from elevated bp  Relevant past medical, surgical, family and social history reviewed and updated as indicated. Interim medical history since our last visit reviewed. Allergies and medications reviewed and updated.   Current Outpatient Prescriptions:  .  ASTAXANTHIN PO, Take 12 mg by mouth daily., Disp: , Rfl:  .  Barberry-Oreg Grape-Goldenseal (BERBERINE COMPLEX PO), Take 1 tablet by mouth 2 (two) times daily., Disp: , Rfl:  .  colesevelam (WELCHOL) 625 MG tablet, Take 3 tablets (1,875 mg total) by mouth 2 (two) times daily with a meal., Disp: 540 tablet, Rfl: 1 .  Fluticasone Propionate (FLONASE ALLERGY RELIEF NA), Place 2 sprays into the nose daily. Into in each nostril, Disp: , Rfl:  .  Ibuprofen (ADVIL PO), Take 600 mg by mouth 3 (three) times daily as needed. , Disp: , Rfl:  .  lisinopril (PRINIVIL,ZESTRIL) 10 MG tablet, Take 1 tablet (10 mg total) by mouth daily., Disp: 90  tablet, Rfl: 1 .  loratadine (CLARITIN) 10 MG tablet, Take 10 mg by mouth daily as needed for allergies., Disp: , Rfl:  .  glipiZIDE (GLUCOTROL) 5 MG tablet, Take 1 tablet (5 mg total) by mouth daily before breakfast. (Patient not taking: Reported on 01/13/2017), Disp: 90 tablet, Rfl: 3 .  Omega-3 Fatty Acids (FISH OIL PO), Take 2 capsules by mouth daily. Reported on 01/07/2016, Disp: , Rfl:    Review of Systems  Constitutional: Positive for diaphoresis. Negative for appetite change, chills, fatigue, fever and unexpected weight change.  HENT: Positive for congestion, dental problem and sneezing. Negative for drooling, ear pain, facial swelling, hearing loss, mouth sores, sore throat, trouble swallowing and voice change.   Eyes: Positive for itching. Negative for pain, discharge, redness and visual disturbance.  Respiratory: Positive for shortness of breath. Negative for cough, choking and wheezing.   Cardiovascular: Positive for palpitations. Negative for chest pain and leg swelling.  Gastrointestinal: Negative for abdominal pain, blood in stool, constipation, diarrhea and vomiting.  Endocrine: Positive for heat intolerance. Negative for cold intolerance and polydipsia.  Genitourinary: Negative for decreased urine volume, dysuria and hematuria.  Musculoskeletal: Positive for back pain and gait problem. Negative for arthralgias.  Skin: Negative for rash.  Allergic/Immunologic: Positive for environmental allergies.  Neurological: Negative for seizures, syncope, light-headedness and headaches.  Hematological: Negative for adenopathy.  Psychiatric/Behavioral: Positive for agitation. Negative for dysphoric mood and suicidal ideas. The patient is nervous/anxious.     Per HPI unless specifically indicated above     Objective:    BP 140/80 (BP Location: Left Arm, Patient Position: Sitting, Cuff Size: Normal)   Pulse (!) 101   Temp 97.7 F (36.5 C)   Ht 5\' 6"  (1.676 m)   Wt 165 lb (74.8 kg)    SpO2 95%   BMI 26.63 kg/m   Wt Readings from Last 3 Encounters:  01/13/17 165 lb (74.8 kg)  12/09/16 166 lb (75.3 kg)  10/27/16 166 lb (75.3 kg)    Physical Exam  Constitutional: She is oriented to person, place, and time. She appears well-developed and well-nourished.  HENT:  Head: Normocephalic and atraumatic.  Neck: Neck supple.  Cardiovascular: Normal rate and regular rhythm.   Pulmonary/Chest: Effort normal and breath sounds normal.  Abdominal: Soft. Bowel sounds are normal. She exhibits no mass. There is no hepatosplenomegaly. There is no tenderness.  Musculoskeletal: She exhibits no edema.  Lymphadenopathy:    She has no cervical adenopathy.  Neurological: She is alert and oriented to person, place, and time.  Skin: Skin is warm and dry.  Psychiatric: She has a normal mood and affect. Her behavior is normal.  Vitals reviewed.        Assessment & Plan:     Encounter Diagnoses  Name Primary?  . Type 2 diabetes mellitus without complication, unspecified whether long term insulin use (HCC) Yes  . Neck pain   . Hyperlipidemia, unspecified hyperlipidemia type   . Depression, unspecified depression type   . Essential hypertension, benign   . Cigarette nicotine dependence without complication     -offered pt screening mammogram but she declined.  She does not want screening mammogram at this time -pt will monitor her bp at home and bring log with her to next OV -pt wants to continue on the berberine for her diabetes even though it has not been tested for safety or efficacy -pt says she will get her cone discount application turned in.  She will let us know if she gets approval prior to her next OV so we can enter referral for orthopedist -[facility] -we reviewed her neck xray including the atherosclerosis of the carotid.  We discussed ways of helping this including controlling her cholesterol, blood pressure, diabetes and stopping  smoking -follow up appointment in 6 weeks. RTO sooner prn

## 2017-02-04 ENCOUNTER — Encounter: Payer: Self-pay | Admitting: Physician Assistant

## 2017-02-04 ENCOUNTER — Ambulatory Visit: Payer: Self-pay | Admitting: Physician Assistant

## 2017-02-04 ENCOUNTER — Ambulatory Visit (HOSPITAL_COMMUNITY)
Admission: RE | Admit: 2017-02-04 | Discharge: 2017-02-04 | Disposition: A | Discharge status: Home / Self Care | Payer: Self-pay | Source: Ambulatory Visit | Attending: Physician Assistant | Admitting: Physician Assistant

## 2017-02-04 VITALS — BP 138/82 | HR 91 | Temp 97.9°F | Ht 66.0 in | Wt 162.5 lb

## 2017-02-04 DIAGNOSIS — M5441 Lumbago with sciatica, right side: Secondary | ICD-10-CM | POA: Insufficient documentation

## 2017-02-04 DIAGNOSIS — M47896 Other spondylosis, lumbar region: Secondary | ICD-10-CM | POA: Insufficient documentation

## 2017-02-04 DIAGNOSIS — I700 Atherosclerosis of aorta: Secondary | ICD-10-CM | POA: Insufficient documentation

## 2017-02-04 MED ORDER — PREDNISONE 10 MG PO TABS: ORAL_TABLET | ORAL | 0 refills | Status: AC

## 2017-02-04 MED ORDER — CYCLOBENZAPRINE HCL 10 MG PO TABS: 10.0000 mg | ORAL_TABLET | Freq: Three times a day (TID) | ORAL | 0 refills | Status: DC | PRN

## 2017-02-04 NOTE — Progress Notes (Signed)
BP 138/82 (BP Location: Left Arm, Patient Position: Sitting, Cuff Size: Normal)   Pulse 91   Temp 97.9 F (36.6 C) (Other (Comment))   Ht 5\' 6"  (1.676 m)   Wt 162 lb 8 oz (73.7 kg)   SpO2 97%   BMI 26.23 kg/m    Subjective:    Patient ID: Faith Blair, female    DOB: 07/02/1961, 56 y.o.   MRN: [id number]  HPI: Faith Blair is a 56 y.o. female presenting on 02/04/2017 for Back Pain   HPI  Pt has still not turned in cone discount application.  She says she is waiting for papers from the [employer].   [facility] is [facility]  She is still smoking  Glipizide (pt won't take it- see previous office note) and fish oil (pt says she can't afford it) are removed from her med list because she isn't taking it and won't be in the foreseeable future.   Pt having lots of back pain- in the lower back. . Says it's radiating to RLE- to the hip and knee.   Says it started this past week while she was vaccuuming.  Pt states that previous problems mostly in her neck.  She says she can barely walk because it hurts so badly.   Relevant past medical, surgical, family and social history reviewed and updated as indicated. Interim medical history since our last visit reviewed. Allergies and medications reviewed and updated.   Current Outpatient Prescriptions:  .  ASTAXANTHIN PO, Take 12 mg by mouth daily., Disp: , Rfl:  .  Barberry-Oreg Grape-Goldenseal (BERBERINE COMPLEX PO), Take 1 tablet by mouth 2 (two) times daily., Disp: , Rfl:  .  colesevelam (WELCHOL) 625 MG tablet, Take 3 tablets (1,875 mg total) by mouth 2 (two) times daily with a meal., Disp: 540 tablet, Rfl: 1 .  Fluticasone Propionate (FLONASE ALLERGY RELIEF NA), Place 2 sprays into the nose daily. Into in each nostril, Disp: , Rfl:  .  Ibuprofen (ADVIL PO), Take 600 mg by mouth 3 (three) times daily as needed. , Disp: , Rfl:  .  lisinopril (PRINIVIL,ZESTRIL) 10 MG tablet, Take 1 tablet (10 mg total) by mouth daily., Disp: 90 tablet, Rfl:  1 .  loratadine (CLARITIN) 10 MG tablet, Take 10 mg by mouth daily as needed for allergies., Disp: , Rfl:   Review of Systems  Constitutional: Negative for appetite change, chills, diaphoresis, fatigue, fever and unexpected weight change.  HENT: Positive for sneezing. Negative for congestion, dental problem, drooling, ear pain, facial swelling, hearing loss, mouth sores, sore throat, trouble swallowing and voice change.   Eyes: Negative for pain, discharge, redness, itching and visual disturbance.  Respiratory: Negative for cough, choking, shortness of breath and wheezing.   Cardiovascular: Positive for palpitations. Negative for chest pain and leg swelling.  Gastrointestinal: Negative for abdominal pain, blood in stool, constipation, diarrhea and vomiting.  Endocrine: Positive for heat intolerance. Negative for cold intolerance and polydipsia.  Genitourinary: Negative for decreased urine volume, dysuria and hematuria.  Musculoskeletal: Positive for arthralgias, back pain and gait problem.  Skin: Negative for rash.  Allergic/Immunologic: Positive for environmental allergies.  Neurological: Negative for seizures, syncope, light-headedness and headaches.  Hematological: Negative for adenopathy.  Psychiatric/Behavioral: Positive for dysphoric mood. Negative for agitation and suicidal ideas. The patient is nervous/anxious.     Per HPI unless specifically indicated above     Objective:    BP 138/82 (BP Location: Left Arm, Patient Position: Sitting, Cuff Size: Normal)  Pulse 91   Temp 97.9 F (36.6 C) (Other (Comment))   Ht 5\' 6"  (1.676 m)   Wt 162 lb 8 oz (73.7 kg)   SpO2 97%   BMI 26.23 kg/m   Wt Readings from Last 3 Encounters:  02/04/17 162 lb 8 oz (73.7 kg)  01/13/17 165 lb (74.8 kg)  12/09/16 166 lb (75.3 kg)    Physical Exam  Constitutional: She is oriented to person, place, and time. She appears well-developed and well-nourished.  HENT:  Head: Normocephalic and  atraumatic.  Neck: Neck supple.  Cardiovascular: Normal rate and regular rhythm.   Pulmonary/Chest: Effort normal and breath sounds normal.  Abdominal: Soft. Bowel sounds are normal. She exhibits no mass. There is no hepatosplenomegaly. There is no tenderness.  Musculoskeletal: She exhibits no edema.       Right hip: She exhibits normal range of motion and no tenderness.       Left hip: She exhibits normal range of motion and no tenderness.       Lumbar back: She exhibits decreased range of motion and pain. She exhibits no tenderness, no bony tenderness, no swelling, no edema and no deformity.  + pain with reclining/sitting up.  - SLR bilat  Lymphadenopathy:    She has no cervical adenopathy.  Neurological: She is alert and oriented to person, place, and time. No sensory deficit. Gait (due to pain in back) abnormal.  Reflex Scores:      Patellar reflexes are 2+ on the right side and 2+ on the left side. Skin: Skin is warm and dry.  Psychiatric: She has a normal mood and affect. Her behavior is normal.  Vitals reviewed.        Assessment & Plan:   Encounter Diagnosis  Name Primary?  . Acute right-sided low back pain with right-sided sciatica Yes    -rx prednisone taper for back.  rx flexeril to use prn (pt says she liked skelaxin in the past but told her approx cost of that med and she said no way).  Pt to use ice/heat on the back.  Will get xray due to pt persistent begging and crying -Pt still declines mammogram -discused with her that She needs to get her cone discount application turned in -Her bp is improved some today -Pt's routine appt is in august with labs to be drawn prior to appointment

## 2017-02-23 ENCOUNTER — Other Ambulatory Visit (HOSPITAL_COMMUNITY)
Admission: RE | Admit: 2017-02-23 | Discharge: 2017-02-23 | Disposition: A | Discharge status: Home / Self Care | Payer: Self-pay | Source: Ambulatory Visit | Attending: Physician Assistant | Admitting: Physician Assistant

## 2017-02-23 DIAGNOSIS — I10 Essential (primary) hypertension: Secondary | ICD-10-CM | POA: Insufficient documentation

## 2017-02-23 DIAGNOSIS — E119 Type 2 diabetes mellitus without complications: Secondary | ICD-10-CM | POA: Insufficient documentation

## 2017-02-23 DIAGNOSIS — E785 Hyperlipidemia, unspecified: Secondary | ICD-10-CM | POA: Insufficient documentation

## 2017-02-23 LAB — LIPID PANEL
Cholesterol: 264 mg/dL — ABNORMAL HIGH (ref 0–200)
HDL: 48 mg/dL (ref >40)
LDL CALC: 143 mg/dL — AB (ref 0–99)
Total CHOL/HDL Ratio: 5.5 RATIO
Triglycerides: 365 mg/dL — ABNORMAL HIGH (ref <150)
VLDL: 73 mg/dL — AB (ref 0–40)

## 2017-02-23 LAB — COMPREHENSIVE METABOLIC PANEL
ALT: 18 U/L (ref 14–54)
ANION GAP: 11 (ref 5–15)
AST: 14 U/L — ABNORMAL LOW (ref 15–41)
Albumin: 4.2 g/dL (ref 3.5–5.0)
Alkaline Phosphatase: 92 U/L (ref 38–126)
BUN: 18 mg/dL (ref 6–20)
CHLORIDE: 101 mmol/L (ref 101–111)
CO2: 25 mmol/L (ref 22–32)
Calcium: 9.5 mg/dL (ref 8.9–10.3)
Creatinine, Ser: 0.7 mg/dL (ref 0.44–1.00)
GFR calc non Af Amer: >60 mL/min (ref >60)
Glucose, Bld: 99 mg/dL (ref 65–99)
POTASSIUM: 3.7 mmol/L (ref 3.5–5.1)
SODIUM: 137 mmol/L (ref 135–145)
Total Bilirubin: 0.8 mg/dL (ref 0.3–1.2)
Total Protein: 7.7 g/dL (ref 6.5–8.1)

## 2017-02-24 ENCOUNTER — Ambulatory Visit: Payer: Self-pay | Admitting: Physician Assistant

## 2017-02-24 ENCOUNTER — Encounter: Payer: Self-pay | Admitting: Physician Assistant

## 2017-02-24 VITALS — BP 156/84 | HR 114 | Temp 97.9°F | Ht 66.0 in | Wt 162.0 lb

## 2017-02-24 DIAGNOSIS — M542 Cervicalgia: Secondary | ICD-10-CM

## 2017-02-24 DIAGNOSIS — F17210 Nicotine dependence, cigarettes, uncomplicated: Secondary | ICD-10-CM

## 2017-02-24 DIAGNOSIS — E119 Type 2 diabetes mellitus without complications: Secondary | ICD-10-CM

## 2017-02-24 DIAGNOSIS — M5441 Lumbago with sciatica, right side: Secondary | ICD-10-CM

## 2017-02-24 DIAGNOSIS — I10 Essential (primary) hypertension: Secondary | ICD-10-CM

## 2017-02-24 DIAGNOSIS — E785 Hyperlipidemia, unspecified: Secondary | ICD-10-CM

## 2017-02-24 DIAGNOSIS — M5416 Radiculopathy, lumbar region: Secondary | ICD-10-CM

## 2017-02-24 DIAGNOSIS — F419 Anxiety disorder, unspecified: Secondary | ICD-10-CM

## 2017-02-24 LAB — HEMOGLOBIN A1C
Hgb A1c MFr Bld: 6.4 % — ABNORMAL HIGH (ref 4.8–5.6)
Mean Plasma Glucose: 137 mg/dL

## 2017-02-24 MED ORDER — IBUPROFEN 800 MG PO TABS: 800.0000 mg | ORAL_TABLET | Freq: Three times a day (TID) | ORAL | 0 refills | Status: DC | PRN

## 2017-02-24 MED ORDER — LISINOPRIL 10 MG PO TABS
10.0000 mg | ORAL_TABLET | Freq: Every day | ORAL | 1 refills | Status: DC
Start: 2017-02-24 — End: 2017-06-09

## 2017-02-24 NOTE — Progress Notes (Signed)
BP (!) 156/84 (BP Location: Left Arm, Patient Position: Sitting, Cuff Size: Normal)   Pulse (!) 114   Temp 97.9 F (36.6 C) (Other (Comment))   Ht 5\' 6"  (1.676 m)   Wt 162 lb (73.5 kg)   SpO2 98%   BMI 26.15 kg/m    Subjective:    Patient ID: Faith Blair, female    DOB: 09/16/1960, 56 y.o.   MRN: [id number]  HPI: Faith Blair is a 56 y.o. female presenting on 02/24/2017 for Follow-up   HPI  Pt finally has [employer] discount!   Pt started her prednisone 02/17/17 so she is still taking it  Pt states constipation.    Pt c/o R leg weakness.  She says she has been using a walker at home to prevent falls.  Pt complains of persistent neck and low back pain.  Pt says she thinks the prednisone may have helped when she first started taking it but now isn't sure.   Pt has copies of her MRI report from 2011 (c-spine and lumbar spine)  Relevant past medical, surgical, family and social history reviewed and updated as indicated. Interim medical history since our last visit reviewed. Allergies and medications reviewed and updated.   Current Outpatient Prescriptions:  .  Barberry-Oreg Grape-Goldenseal (BERBERINE COMPLEX PO), Take 1 tablet by mouth 2 (two) times daily., Disp: , Rfl:  .  cyclobenzaprine (FLEXERIL) 10 MG tablet, Take 1 tablet (10 mg total) by mouth 3 (three) times daily as needed for muscle spasms., Disp: 20 tablet, Rfl: 0 .  Fluticasone Propionate (FLONASE ALLERGY RELIEF NA), Place 2 sprays into the nose daily. Into in each nostril, Disp: , Rfl:  .  Ibuprofen (ADVIL PO), Take 600 mg by mouth 3 (three) times daily as needed. , Disp: , Rfl:  .  loratadine (CLARITIN) 10 MG tablet, Take 10 mg by mouth daily as needed for allergies., Disp: , Rfl:  .  predniSONE (STERAPRED UNI-PAK 21 TAB) 10 MG (21) TBPK tablet, Take 10 mg by mouth daily., Disp: , Rfl:  .  ASTAXANTHIN PO, Take 12 mg by mouth daily., Disp: , Rfl:  .  colesevelam (WELCHOL) 625 MG tablet, Take 3 tablets (1,875 mg total) by  mouth 2 (two) times daily with a meal. (Patient not taking: Reported on 02/24/2017), Disp: 540 tablet, Rfl: 1 .  lisinopril (PRINIVIL,ZESTRIL) 10 MG tablet, Take 1 tablet (10 mg total) by mouth daily. (Patient not taking: Reported on 02/24/2017), Disp: 90 tablet, Rfl: 1   Review of Systems  Constitutional: Positive for diaphoresis and fatigue. Negative for appetite change, chills, fever and unexpected weight change.  HENT: Positive for congestion and sneezing. Negative for dental problem, drooling, ear pain, facial swelling, hearing loss, mouth sores, sore throat, trouble swallowing and voice change.   Eyes: Positive for itching. Negative for pain, discharge, redness and visual disturbance.  Respiratory: Positive for cough. Negative for choking, shortness of breath and wheezing.   Cardiovascular: Positive for palpitations and leg swelling. Negative for chest pain.  Gastrointestinal: Negative for abdominal pain, blood in stool, constipation, diarrhea and vomiting.  Endocrine: Positive for heat intolerance. Negative for cold intolerance and polydipsia.  Genitourinary: Negative for decreased urine volume, dysuria and hematuria.  Musculoskeletal: Positive for arthralgias, back pain and gait problem.  Skin: Negative for rash.  Allergic/Immunologic: Positive for environmental allergies.  Neurological: Negative for seizures, syncope, light-headedness and headaches.  Hematological: Negative for adenopathy.  Psychiatric/Behavioral: Positive for agitation and dysphoric mood. Negative for suicidal ideas. The patient is  nervous/anxious.     Per HPI unless specifically indicated above     Objective:    BP (!) 156/84 (BP Location: Left Arm, Patient Position: Sitting, Cuff Size: Normal)   Pulse (!) 114   Temp 97.9 F (36.6 C) (Other (Comment))   Ht 5\' 6"  (1.676 m)   Wt 162 lb (73.5 kg)   SpO2 98%   BMI 26.15 kg/m   Wt Readings from Last 3 Encounters:  02/24/17 162 lb (73.5 kg)  02/04/17 162 lb 8 oz  (73.7 kg)  01/13/17 165 lb (74.8 kg)    Physical Exam  Constitutional: She is oriented to person, place, and time. She appears well-developed and well-nourished.  HENT:  Head: Normocephalic and atraumatic.  Neck: Neck supple.  Cardiovascular: Normal rate and regular rhythm.   Pulmonary/Chest: Effort normal and breath sounds normal.  Abdominal: Soft. Bowel sounds are normal. She exhibits no mass. There is no hepatosplenomegaly. There is no tenderness.  Musculoskeletal: She exhibits no edema.       Cervical back: She exhibits pain. She exhibits no bony tenderness.       Lumbar back: She exhibits pain. She exhibits no bony tenderness.  Lymphadenopathy:    She has no cervical adenopathy.  Neurological: She is alert and oriented to person, place, and time.  Reflex Scores:      Patellar reflexes are 1+ on the right side and 3+ on the left side. There appears to be some weakness RLE.  No weakness upper extremities.   Skin: Skin is warm and dry.  Psychiatric: She has a normal mood and affect. Her behavior is normal.  Vitals reviewed.   Results for orders placed or performed during the hospital encounter of 02/23/17  Hemoglobin A1c  Result Value Ref Range   Hgb A1c MFr Bld 6.4 (H) 4.8 - 5.6 %   Mean Plasma Glucose 137 mg/dL  Lipid panel  Result Value Ref Range   Cholesterol 264 (H) 0 - 200 mg/dL   Triglycerides 161 (H) <150 mg/dL   HDL 48 >09 mg/dL   Total CHOL/HDL Ratio 5.5 RATIO   VLDL 73 (H) 0 - 40 mg/dL   LDL Cholesterol 604 (H) 0 - 99 mg/dL  Comprehensive metabolic panel  Result Value Ref Range   Sodium 137 135 - 145 mmol/L   Potassium 3.7 3.5 - 5.1 mmol/L   Chloride 101 101 - 111 mmol/L   CO2 25 22 - 32 mmol/L   Glucose, Bld 99 65 - 99 mg/dL   BUN 18 6 - 20 mg/dL   Creatinine, Ser 5.40 0.44 - 1.00 mg/dL   Calcium 9.5 8.9 - 98.1 mg/dL   Total Protein 7.7 6.5 - 8.1 g/dL   Albumin 4.2 3.5 - 5.0 g/dL   AST 14 (L) 15 - 41 U/L   ALT 18 14 - 54 U/L   Alkaline Phosphatase 92  38 - 126 U/L   Total Bilirubin 0.8 0.3 - 1.2 mg/dL   GFR calc non Af Amer >60 >60 mL/min   GFR calc Af Amer >60 >60 mL/min   Anion gap 11 5 - 15      Assessment & Plan:   Encounter Diagnoses  Name Primary?  . Low back pain with right-sided sciatica, unspecified back pain laterality, unspecified chronicity Yes  . Lumbar radiculopathy   . Neck pain   . Diet-controlled diabetes mellitus (HCC)   . Essential hypertension, benign   . Hyperlipidemia, unspecified hyperlipidemia type   . Cigarette nicotine dependence without  complication   . Anxiety     -reviewed labs with pt -Pt counseled to avoid runing out of her lisinopril -Discussed statin but doesnt' want any -Pt referred to orthopedics for neck and low back pain. Discussed MRI but if she can get in soon with orthopedics will let them order it -no changes in medication -will have pt follow up in 6 weeks to recheck the bp. RTO sooner prn new symptoms or worsening

## 2017-02-26 ENCOUNTER — Encounter: Payer: Self-pay | Admitting: Orthopaedic Surgery

## 2017-03-02 ENCOUNTER — Other Ambulatory Visit: Payer: Self-pay | Admitting: Physician Assistant

## 2017-03-02 DIAGNOSIS — M5441 Lumbago with sciatica, right side: Secondary | ICD-10-CM

## 2017-03-06 ENCOUNTER — Ambulatory Visit (HOSPITAL_COMMUNITY)
Admission: RE | Admit: 2017-03-06 | Discharge: 2017-03-06 | Disposition: A | Discharge status: Home / Self Care | Payer: Self-pay | Source: Ambulatory Visit | Attending: Physician Assistant | Admitting: Physician Assistant

## 2017-03-06 DIAGNOSIS — M4807 Spinal stenosis, lumbosacral region: Secondary | ICD-10-CM | POA: Insufficient documentation

## 2017-03-06 DIAGNOSIS — M47897 Other spondylosis, lumbosacral region: Secondary | ICD-10-CM | POA: Insufficient documentation

## 2017-03-06 DIAGNOSIS — M48061 Spinal stenosis, lumbar region without neurogenic claudication: Secondary | ICD-10-CM | POA: Insufficient documentation

## 2017-03-06 DIAGNOSIS — M5126 Other intervertebral disc displacement, lumbar region: Secondary | ICD-10-CM | POA: Insufficient documentation

## 2017-03-06 DIAGNOSIS — M5441 Lumbago with sciatica, right side: Secondary | ICD-10-CM | POA: Insufficient documentation

## 2017-03-11 ENCOUNTER — Telehealth: Payer: Self-pay | Admitting: Orthopaedic Surgery

## 2017-03-11 NOTE — Telephone Encounter (Signed)
Per Dr Hilda Lias today, 03/11/17, at 1:38p.m, relays to contact [facility] and advise that patient needs to be referred to neurosurgeon. I have contacted [facility], spoke with [facility], and have also called back to patient to relay.  Patient voices understanding and aware will be further advised by her primary care provider's office/[facility].

## 2017-03-11 NOTE — Telephone Encounter (Signed)
Call from provider at [facility] [place], Manuella Ghazi [phone]) regarding a referral for back and neck pain.  We had contacted patient upon initially receiving referral to discuss appointment, and patient relayed history of previous surgery with hardware in her neck, [place] [facility] [employer]. Discussed protocol of previous notes/reports, and patient at that time, felt she was best asking her provider to refer her elsewhere, and/or, order an MRI.   Manuella Ghazi states that MRI has now been done, and that patient has not yet been seen by anyone. Please review and advise as to what may be needed from previous surgeon, prior to scheduling, or if any other recommendations.

## 2017-04-02 ENCOUNTER — Other Ambulatory Visit: Payer: Self-pay | Admitting: Physician Assistant

## 2017-04-02 MED ORDER — CYCLOBENZAPRINE HCL 10 MG PO TABS: 10.0000 mg | ORAL_TABLET | Freq: Three times a day (TID) | ORAL | 0 refills | Status: DC | PRN

## 2017-04-03 ENCOUNTER — Other Ambulatory Visit: Payer: Self-pay | Admitting: Physician Assistant

## 2017-04-07 ENCOUNTER — Ambulatory Visit: Payer: Self-pay | Admitting: Physician Assistant

## 2017-04-07 ENCOUNTER — Encounter: Payer: Self-pay | Admitting: Physician Assistant

## 2017-04-07 VITALS — BP 151/96 | HR 114 | Temp 98.3°F

## 2017-04-07 DIAGNOSIS — T63481A Toxic effect of venom of other arthropod, accidental (unintentional), initial encounter: Secondary | ICD-10-CM

## 2017-04-07 DIAGNOSIS — I10 Essential (primary) hypertension: Secondary | ICD-10-CM

## 2017-04-07 DIAGNOSIS — M5441 Lumbago with sciatica, right side: Secondary | ICD-10-CM

## 2017-04-07 DIAGNOSIS — M5416 Radiculopathy, lumbar region: Secondary | ICD-10-CM

## 2017-04-07 MED ORDER — METHOCARBAMOL 500 MG PO TABS
500.0000 mg | ORAL_TABLET | Freq: Three times a day (TID) | ORAL | 0 refills | Status: DC | PRN
Start: 2017-04-07 — End: 2017-07-31

## 2017-04-07 NOTE — Progress Notes (Signed)
BP (!) 188/113   Pulse (!) 129   Temp 98.3 F (36.8 C)   SpO2 99%    Subjective:    Patient ID: Faith Blair, female    DOB: 07-28-1960, 56 y.o.   MRN: [id number]  HPI: Faith Blair is a 56 y.o. female presenting on 04/07/2017 for Hypertension   HPI   Pt says she had panic attack on the way to our office.  She also says she got stung by a bee on the way here (panic attack preceeded bee sting)- about 20 minutes ago.  She has had problems in the past with SOB from bee sting.  She has not had any SOB at present time.    Pt says her flexeril doesn't work.  She says she wants something she can get locally because medassist will take 10 days.  Referral has been made to [facility] neurosurgery but pt says she hasn't heard from them.   Pt says she has been monitoring her bp at home and it has been running "around 131 on the top"  Relevant past medical, surgical, family and social history reviewed and updated as indicated. Interim medical history since our last visit reviewed. Allergies and medications reviewed and updated.   Current Outpatient Prescriptions:  .  Barberry-Oreg Grape-Goldenseal (BERBERINE COMPLEX PO), Take 1 tablet by mouth 2 (two) times daily., Disp: , Rfl:  .  Fluticasone Propionate (FLONASE ALLERGY RELIEF NA), Place 2 sprays into the nose daily. Into in each nostril, Disp: , Rfl:  .  IBU 800 MG tablet, TAKE 1 Tablet  BY MOUTH EVERY 8 HOURS AS NEEDED, Disp: 90 tablet, Rfl: 0 .  lisinopril (PRINIVIL,ZESTRIL) 10 MG tablet, Take 1 tablet (10 mg total) by mouth daily., Disp: 90 tablet, Rfl: 1 .  loratadine (CLARITIN) 10 MG tablet, Take 10 mg by mouth daily as needed for allergies., Disp: , Rfl:  .  ASTAXANTHIN PO, Take 12 mg by mouth daily., Disp: , Rfl:  .  colesevelam (WELCHOL) 625 MG tablet, Take 3 tablets (1,875 mg total) by mouth 2 (two) times daily with a meal. (Patient not taking: Reported on 02/24/2017), Disp: 540 tablet, Rfl: 1 .  cyclobenzaprine (FLEXERIL) 10 MG tablet,  Take 1 tablet (10 mg total) by mouth 3 (three) times daily as needed for muscle spasms. (Patient not taking: Reported on 04/07/2017), Disp: 30 tablet, Rfl: 0   Review of Systems  Constitutional: Negative for appetite change, chills, diaphoresis, fatigue, fever and unexpected weight change.  HENT: Negative for congestion, dental problem, drooling, ear pain, facial swelling, hearing loss, mouth sores, sneezing, sore throat, trouble swallowing and voice change.   Eyes: Negative for pain, discharge, redness, itching and visual disturbance.  Respiratory: Negative for cough, choking, shortness of breath and wheezing.   Cardiovascular: Positive for leg swelling. Negative for chest pain and palpitations.  Gastrointestinal: Positive for constipation. Negative for abdominal pain, blood in stool, diarrhea and vomiting.  Endocrine: Positive for cold intolerance and heat intolerance. Negative for polydipsia.  Genitourinary: Negative for decreased urine volume, dysuria and hematuria.  Musculoskeletal: Positive for arthralgias, back pain and gait problem.  Skin: Negative for rash.  Allergic/Immunologic: Positive for environmental allergies.  Neurological: Negative for seizures, syncope, light-headedness and headaches.  Hematological: Negative for adenopathy.  Psychiatric/Behavioral: Positive for dysphoric mood. Negative for agitation and suicidal ideas. The patient is nervous/anxious.     Per HPI unless specifically indicated above     Objective:    BP (!) 188/113   Pulse (!) 129  Temp 98.3 F (36.8 C)   SpO2 99%   Wt Readings from Last 3 Encounters:  02/24/17 162 lb (73.5 kg)  02/04/17 162 lb 8 oz (73.7 kg)  01/13/17 165 lb (74.8 kg)    Physical Exam  Constitutional: She is oriented to person, place, and time. She appears well-developed and well-nourished.  HENT:  Head: Normocephalic and atraumatic.  Neck: Neck supple.  Cardiovascular: Normal rate and regular rhythm.   Pulmonary/Chest:  Effort normal and breath sounds normal.  Abdominal: Soft. Bowel sounds are normal. She exhibits no mass. There is no hepatosplenomegaly. There is no tenderness.  Musculoskeletal: She exhibits no edema.  Lymphadenopathy:    She has no cervical adenopathy.  Neurological: She is alert and oriented to person, place, and time. Gait (walking with rolling walker) abnormal.  Skin: Skin is warm and dry.  Psychiatric: Her mood appears anxious. Her affect is angry and blunt. Her speech is rapid and/or pressured. She is agitated and hyperactive. She exhibits a depressed mood.  Tearful at times.  Vitals reviewed.         Assessment & Plan:    Encounter Diagnoses  Name Primary?  . Low back pain with right-sided sciatica, unspecified back pain laterality, unspecified chronicity Yes  . Lumbar radiculopathy   . Essential hypertension, benign   . Insect stings, accidental or unintentional, initial encounter     -Pt has been referred to orthopedics who recommended pt be seen by neurosurgeon.  She has been referred to neurosurgery at [facility].  She is awaiting call from financial assistance at that facility.  Pt is given the phone number so she can call and get that going.   -pt is given rx for robaxin because she says the flexeril didn't work for her -pt talks about how unfair it is that she was denied for medicaid and maybe she should just move to another state with better healthcare for the uninsured.  Told her that was her decision. -pt was observed 40 minutes without sob or swelling.  It was recommended she take benedryl and use ice on the sting area to minimize local reaction. -discussed with pt that we have done what we can for her back- referred her to orthopedics and the to neurosurgery based on their recommendation. -pt is to follow up 2 months to recheck bp.  She is to RTO sooner prn

## 2017-04-07 NOTE — Patient Instructions (Addendum)
Artist at [employer]   [phone] / [phone]

## 2017-05-07 ENCOUNTER — Other Ambulatory Visit: Payer: Self-pay | Admitting: Physician Assistant

## 2017-05-12 ENCOUNTER — Encounter: Payer: Self-pay | Admitting: Physician Assistant

## 2017-05-12 NOTE — Progress Notes (Signed)
Pt called c/o feeling like her heart is pounding, losing her breath, feeling winded all during exertion. Pt states s/sx have been happening for a month now. Pt states when going up stairs in her home she has to climb halfway in order to catch her breath before finishing. Pt also mentioned she has d/c her HTN meds and her muscle relaxers. Pt states her BP has been running 112/81 as reason why she d/c HTN med. Pt states she last went [facility] about 3 weeks ago  PA advised pt to restart HTN meds and recommended smoking cessation. Pt verbalized understanding.

## 2017-05-13 ENCOUNTER — Other Ambulatory Visit: Payer: Self-pay | Admitting: Physician Assistant

## 2017-06-09 ENCOUNTER — Encounter: Payer: Self-pay | Admitting: Physician Assistant

## 2017-06-09 ENCOUNTER — Ambulatory Visit: Payer: Self-pay | Admitting: Physician Assistant

## 2017-06-09 ENCOUNTER — Other Ambulatory Visit (HOSPITAL_COMMUNITY)
Admission: RE | Admit: 2017-06-09 | Discharge: 2017-06-09 | Disposition: A | Discharge status: Home / Self Care | Payer: Self-pay | Source: Ambulatory Visit | Attending: Physician Assistant | Admitting: Physician Assistant

## 2017-06-09 VITALS — BP 112/88 | HR 125 | Temp 98.1°F | Ht 66.0 in | Wt 158.2 lb

## 2017-06-09 DIAGNOSIS — R002 Palpitations: Secondary | ICD-10-CM

## 2017-06-09 DIAGNOSIS — R0602 Shortness of breath: Secondary | ICD-10-CM | POA: Insufficient documentation

## 2017-06-09 DIAGNOSIS — F17219 Nicotine dependence, cigarettes, with unspecified nicotine-induced disorders: Secondary | ICD-10-CM

## 2017-06-09 DIAGNOSIS — I10 Essential (primary) hypertension: Secondary | ICD-10-CM

## 2017-06-09 DIAGNOSIS — M5441 Lumbago with sciatica, right side: Secondary | ICD-10-CM

## 2017-06-09 DIAGNOSIS — E119 Type 2 diabetes mellitus without complications: Secondary | ICD-10-CM

## 2017-06-09 DIAGNOSIS — R000 Tachycardia, unspecified: Secondary | ICD-10-CM | POA: Insufficient documentation

## 2017-06-09 DIAGNOSIS — M5416 Radiculopathy, lumbar region: Secondary | ICD-10-CM

## 2017-06-09 DIAGNOSIS — F39 Unspecified mood [affective] disorder: Secondary | ICD-10-CM

## 2017-06-09 DIAGNOSIS — F419 Anxiety disorder, unspecified: Secondary | ICD-10-CM

## 2017-06-09 LAB — CBC
HEMATOCRIT: 42.1 % (ref 36.0–46.0)
HEMOGLOBIN: 13.8 g/dL (ref 12.0–15.0)
MCH: 30.1 pg (ref 26.0–34.0)
MCHC: 32.8 g/dL (ref 30.0–36.0)
MCV: 91.9 fL (ref 78.0–100.0)
Platelets: 237 10*3/uL (ref 150–400)
RBC: 4.58 MIL/uL (ref 3.87–5.11)
RDW: 13.3 % (ref 11.5–15.5)
WBC: 10.9 10*3/uL — ABNORMAL HIGH (ref 4.0–10.5)

## 2017-06-09 LAB — BASIC METABOLIC PANEL
Anion gap: 8 (ref 5–15)
BUN: 14 mg/dL (ref 6–20)
CHLORIDE: 103 mmol/L (ref 101–111)
CO2: 26 mmol/L (ref 22–32)
Calcium: 9.6 mg/dL (ref 8.9–10.3)
Creatinine, Ser: 0.6 mg/dL (ref 0.44–1.00)
GFR calc Af Amer: >60 mL/min (ref >60)
GFR calc non Af Amer: >60 mL/min (ref >60)
GLUCOSE: 112 mg/dL — AB (ref 65–99)
POTASSIUM: 3.9 mmol/L (ref 3.5–5.1)
SODIUM: 137 mmol/L (ref 135–145)

## 2017-06-09 LAB — BRAIN NATRIURETIC PEPTIDE: B Natriuretic Peptide: 94 pg/mL (ref 0.0–100.0)

## 2017-06-09 LAB — TSH: TSH: 0.946 u[IU]/mL (ref 0.350–4.500)

## 2017-06-09 MED ORDER — ICOSAPENT ETHYL 1 G PO CAPS: 1.0000 | ORAL_CAPSULE | Freq: Two times a day (BID) | ORAL | 1 refills | Status: DC

## 2017-06-09 MED ORDER — METOPROLOL TARTRATE 25 MG PO TABS: 25.0000 mg | ORAL_TABLET | Freq: Two times a day (BID) | ORAL | Status: DC

## 2017-06-09 NOTE — Progress Notes (Signed)
BP 112/88 (BP Location: Left Arm, Patient Position: Sitting, Cuff Size: Normal)   Pulse (!) 125   Temp 98.1 F (36.7 C)   Ht 5\' 6"  (1.676 m)   Wt 158 lb 4 oz (71.8 kg)   SpO2 99%   BMI 25.54 kg/m    Subjective:    Patient ID: Faith Blair, female    DOB: 08/26/1960, 56 y.o.   MRN: [id number]  HPI: Faith Blair is a 56 y.o. female presenting on 06/09/2017 for Hypertension and Palpitations   HPI   Pt says she [facility] but has been told it's under review.   Care everywhere shows that she should be booked.   Nurse called and was told that pt needs to call financial counseling  Pt says she has been off her herbal pills for a bout a month but plans to restart it next week.   Pt was going [facility] in the past for panic attacks.  She stopped going when she became unable to drive.   She hasn't been anywhere since the summer.    Pt with ekg okay 10/2016.  tsh nl 2017.  Pt says SOB. She is still smoking.  She Says she gets sob with brushing her teeth.  She complains of palpitations at times.  She says her chest hurts while she is palpitating but not at other times.  She gets sob unrelated to the palpitations.   Pt wants rx for vescapa    Relevant past medical, surgical, family and social history reviewed and updated as indicated. Interim medical history since our last visit reviewed. Allergies and medications reviewed and updated.  CURRENT MEDS: Lisinopril 10 IBU 800  Review of Systems  Constitutional: Positive for appetite change, diaphoresis, fatigue and unexpected weight change. Negative for chills and fever.  HENT: Positive for congestion. Negative for dental problem, drooling, ear pain, facial swelling, hearing loss, mouth sores, sneezing, sore throat, trouble swallowing and voice change.   Eyes: Negative for pain, discharge, redness, itching and visual disturbance.  Respiratory: Positive for shortness of breath. Negative for cough, choking and  wheezing.   Cardiovascular: Positive for chest pain (with palpitations) and palpitations. Negative for leg swelling.  Gastrointestinal: Negative for abdominal pain, blood in stool, constipation, diarrhea and vomiting.  Endocrine: Positive for heat intolerance. Negative for cold intolerance and polydipsia.  Genitourinary: Negative for decreased urine volume, dysuria and hematuria.  Musculoskeletal: Positive for arthralgias, back pain and gait problem.  Skin: Negative for rash.  Allergic/Immunologic: Positive for environmental allergies.  Neurological: Negative for seizures, syncope, light-headedness and headaches.  Hematological: Negative for adenopathy.  Psychiatric/Behavioral: Positive for dysphoric mood. Negative for agitation and suicidal ideas. The patient is nervous/anxious.     Per HPI unless specifically indicated above     Objective:    BP 112/88 (BP Location: Left Arm, Patient Position: Sitting, Cuff Size: Normal)   Pulse (!) 125   Temp 98.1 F (36.7 C)   Ht 5\' 6"  (1.676 m)   Wt 158 lb 4 oz (71.8 kg)   SpO2 99%   BMI 25.54 kg/m   Wt Readings from Last 3 Encounters:  06/09/17 158 lb 4 oz (71.8 kg)  02/24/17 162 lb (73.5 kg)  02/04/17 162 lb 8 oz (73.7 kg)    Physical Exam  Constitutional: She is oriented to person, place, and time. She appears well-developed and well-nourished.  HENT:  Head: Normocephalic and atraumatic.  Neck: Neck supple.  Cardiovascular: Normal rate and  regular rhythm.  Pulmonary/Chest: Effort normal and breath sounds normal.  Abdominal: Soft. Bowel sounds are normal. She exhibits no mass. There is no hepatosplenomegaly. There is no tenderness.  Musculoskeletal: She exhibits no edema.  Lymphadenopathy:    She has no cervical adenopathy.  Neurological: She is alert and oriented to person, place, and time.  Skin: Skin is warm and dry.  Psychiatric: She has a normal mood and affect. Her speech is normal and behavior is normal.  Thoughts jump  around- will change to different topic prior to finishing discussion of one complaint  Vitals reviewed.    EKG- ST at 126.  Similar to EKG done 10/27/16 except rate change.  No apparent ST elevation     Assessment & Plan:    Encounter Diagnoses  Name Primary?  . Essential hypertension, benign Yes  . Tachycardia   . SOB (shortness of breath)   . Anxiety   . Cigarette nicotine dependence with nicotine-induced disorder   . Low back pain with right-sided sciatica, unspecified back pain laterality, unspecified chronicity   . Lumbar radiculopathy   . Diet-controlled diabetes mellitus (HCC)   . Heart palpitations   . Mood disorder (HCC)      -pt to get Labs this afternoon when leaves office. -Echo was scheduled for next Thursday.  pt says she has [employer] discount-  -D/c lisinopril.  rx metoprolol   -start Baby aspirin 1 daily  -rx for vescepta (fish oil) given -encouraged pt to return to counseling for MH/panic attacks  -Pt ws given information to call [facility] so she can get appt with neurosurgery  -pt to follow up in 2 weeks to review labs, echo, effect of metoprolol.  RTO sooner prn

## 2017-06-16 ENCOUNTER — Other Ambulatory Visit: Payer: Self-pay | Admitting: Physician Assistant

## 2017-06-16 DIAGNOSIS — R000 Tachycardia, unspecified: Secondary | ICD-10-CM

## 2017-06-16 DIAGNOSIS — R0602 Shortness of breath: Secondary | ICD-10-CM

## 2017-06-18 ENCOUNTER — Ambulatory Visit (INDEPENDENT_AMBULATORY_CARE_PROVIDER_SITE_OTHER): Payer: Self-pay

## 2017-06-18 ENCOUNTER — Other Ambulatory Visit: Payer: Self-pay

## 2017-06-18 DIAGNOSIS — R0602 Shortness of breath: Secondary | ICD-10-CM

## 2017-06-18 DIAGNOSIS — R000 Tachycardia, unspecified: Secondary | ICD-10-CM

## 2017-06-23 ENCOUNTER — Encounter: Payer: Self-pay | Admitting: Physician Assistant

## 2017-06-23 ENCOUNTER — Ambulatory Visit: Payer: Medicaid Other | Admitting: Physician Assistant

## 2017-06-23 VITALS — BP 124/82 | HR 123 | Temp 97.7°F | Ht 66.0 in | Wt 157.0 lb

## 2017-06-23 DIAGNOSIS — M5416 Radiculopathy, lumbar region: Secondary | ICD-10-CM

## 2017-06-23 DIAGNOSIS — I10 Essential (primary) hypertension: Secondary | ICD-10-CM

## 2017-06-23 DIAGNOSIS — F17219 Nicotine dependence, cigarettes, with unspecified nicotine-induced disorders: Secondary | ICD-10-CM

## 2017-06-23 DIAGNOSIS — F419 Anxiety disorder, unspecified: Secondary | ICD-10-CM

## 2017-06-23 DIAGNOSIS — M5441 Lumbago with sciatica, right side: Secondary | ICD-10-CM

## 2017-06-23 DIAGNOSIS — R000 Tachycardia, unspecified: Secondary | ICD-10-CM

## 2017-06-23 NOTE — Progress Notes (Signed)
BP 124/82 (BP Location: Left Arm, Patient Position: Sitting, Cuff Size: Normal)   Pulse (!) 123   Temp 97.7 F (36.5 C) (Other (Comment))   Ht 5\' 6"  (1.676 m)   Wt 157 lb (71.2 kg)   SpO2 96%   BMI 25.34 kg/m    Subjective:    Patient ID: Faith Blair, female    DOB: 06/09/1961, 56 y.o.   MRN: [id number]  HPI: Faith Blair is a 56 y.o. female presenting on 06/23/2017 for Follow-up (has questions about scattered reddened areas to skin that itch and flake)   HPI   -Pt did not contact MH about returning for treatment of her panic attacks as recommended at last OV  -Pt feels like the metoprolol is helping her palpitations.   -Pt says she [facility].   She says she was approved for scheduling but has not yet gotten an appointment  -pt wants something for red spot on her forehead and a few spots that are flaking up higher on her forehead near her hair  Relevant past medical, surgical, family and social history reviewed and updated as indicated. Interim medical history since our last visit reviewed. Allergies and medications reviewed and updated.  CURRENT MEDS: Metoprolol 25mg  bid IBU 800 prn Aspirin 81mg  qd astaxanthin  Review of Systems  Constitutional: Positive for diaphoresis and fatigue. Negative for appetite change, chills, fever and unexpected weight change.  HENT: Negative for congestion, drooling, ear pain, facial swelling, hearing loss, mouth sores, sneezing, sore throat, trouble swallowing and voice change.   Eyes: Negative for pain, discharge, redness, itching and visual disturbance.  Respiratory: Positive for shortness of breath. Negative for cough, choking and wheezing.   Cardiovascular: Positive for chest pain and palpitations. Negative for leg swelling.  Gastrointestinal: Negative for abdominal pain, blood in stool, constipation, diarrhea and vomiting.  Endocrine: Positive for heat intolerance. Negative for cold intolerance and  polydipsia.  Genitourinary: Negative for decreased urine volume, dysuria and hematuria.  Musculoskeletal: Positive for arthralgias, back pain and gait problem.  Skin: Positive for rash.  Allergic/Immunologic: Positive for environmental allergies.  Neurological: Positive for light-headedness. Negative for seizures, syncope and headaches.  Hematological: Negative for adenopathy.  Psychiatric/Behavioral: Negative for agitation, dysphoric mood and suicidal ideas. The patient is nervous/anxious.     Per HPI unless specifically indicated above     Objective:    BP 124/82 (BP Location: Left Arm, Patient Position: Sitting, Cuff Size: Normal)   Pulse (!) 123   Temp 97.7 F (36.5 C) (Other (Comment))   Ht 5\' 6"  (1.676 m)   Wt 157 lb (71.2 kg)   SpO2 96%   BMI 25.34 kg/m   Wt Readings from Last 3 Encounters:  06/23/17 157 lb (71.2 kg)  06/09/17 158 lb 4 oz (71.8 kg)  02/24/17 162 lb (73.5 kg)    Physical Exam  Constitutional: She is oriented to person, place, and time. She appears well-developed and well-nourished.  HENT:  Head: Normocephalic and atraumatic.  Neck: Neck supple.  Cardiovascular: Regular rhythm and normal heart sounds. Tachycardia present.  Pulmonary/Chest: Effort normal and breath sounds normal.  Abdominal: Soft. Bowel sounds are normal. She exhibits no mass. There is no hepatosplenomegaly. There is no tenderness.  Musculoskeletal: She exhibits no edema.  Lymphadenopathy:    She has no cervical adenopathy.  Neurological: She is alert and oriented to person, place, and time.  Skin: Skin is warm and dry. Rash noted.  Approximately 1 cm size reddish round dry  patch on center of forehead.  No other lesion seen  Psychiatric: She has a normal mood and affect. Her behavior is normal.  Vitals reviewed.   Results for orders placed or performed during the hospital encounter of 06/09/17  B Nat Peptide  Result Value Ref Range   B Natriuretic Peptide 94.0 0.0 - 100.0 pg/mL   TSH  Result Value Ref Range   TSH 0.946 0.350 - 4.500 uIU/mL  Basic Metabolic Panel (BMET)  Result Value Ref Range   Sodium 137 135 - 145 mmol/L   Potassium 3.9 3.5 - 5.1 mmol/L   Chloride 103 101 - 111 mmol/L   CO2 26 22 - 32 mmol/L   Glucose, Bld 112 (H) 65 - 99 mg/dL   BUN 14 6 - 20 mg/dL   Creatinine, Ser 1.610.60 0.44 - 1.00 mg/dL   Calcium 9.6 8.9 - 09.610.3 mg/dL   GFR calc non Af Amer >60 >60 mL/min   GFR calc Af Amer >60 >60 mL/min   Anion gap 8 5 - 15  CBC  Result Value Ref Range   WBC 10.9 (H) 4.0 - 10.5 K/uL   RBC 4.58 3.87 - 5.11 MIL/uL   Hemoglobin 13.8 12.0 - 15.0 g/dL   HCT 04.542.1 40.936.0 - 81.146.0 %   MCV 91.9 78.0 - 100.0 fL   MCH 30.1 26.0 - 34.0 pg   MCHC 32.8 30.0 - 36.0 g/dL   RDW 91.413.3 78.211.5 - 95.615.5 %   Platelets 237 150 - 400 K/uL      Assessment & Plan:   Encounter Diagnoses  Name Primary?  . Tachycardia Yes  . Essential hypertension, benign   . Anxiety   . Cigarette nicotine dependence with nicotine-induced disorder   . Low back pain with right-sided sciatica, unspecified back pain laterality, unspecified chronicity   . Lumbar radiculopathy     -reviewed labs and echo with pt -will Refer to cardiology for further evaluation of tachycardia.  Pt says her [employer] discount is active -[facility] -recommended otc corticsone for red spot on forehead -will have nurse [facility]'s referral to neurosurgeon -pt to continue the metoprolol and ASA -pt to follow up in 6 weeks.  RTO sooner prn

## 2017-06-25 ENCOUNTER — Telehealth: Payer: Self-pay | Admitting: Student

## 2017-06-25 NOTE — Telephone Encounter (Signed)
Called [facility] to f/u on referral. Per [facility] staff,  financial counseling staff's note from November states "review pending" meaning pt has not been cleared by financial counseling yet. Staff states she will contact financial counseling to f/u on this and will then contact the patient.

## 2017-06-25 NOTE — Telephone Encounter (Signed)
-----   Message from Jacquelin HawkingShannon McElroy, [place] sent at 06/23/2017  5:07 PM EST ----- Can you please contact [facility] about pt appt.  She says she talked with financial counselor and was told she was ready for scheduling but she has not yet gotten an appt.  thanks

## 2017-07-10 ENCOUNTER — Encounter (HOSPITAL_COMMUNITY): Payer: Self-pay | Admitting: Emergency Medicine

## 2017-07-10 ENCOUNTER — Ambulatory Visit: Payer: Medicaid Other | Admitting: Cardiovascular Disease

## 2017-07-10 ENCOUNTER — Other Ambulatory Visit: Payer: Self-pay

## 2017-07-10 ENCOUNTER — Emergency Department (HOSPITAL_COMMUNITY): Payer: Medicaid Other

## 2017-07-10 ENCOUNTER — Emergency Department (HOSPITAL_COMMUNITY)
Admission: EM | Admit: 2017-07-10 | Discharge: 2017-07-10 | Disposition: A | Discharge status: Home / Self Care | Payer: Medicaid Other | Attending: Emergency Medicine | Admitting: Emergency Medicine

## 2017-07-10 DIAGNOSIS — F17210 Nicotine dependence, cigarettes, uncomplicated: Secondary | ICD-10-CM | POA: Diagnosis not present

## 2017-07-10 DIAGNOSIS — R0600 Dyspnea, unspecified: Secondary | ICD-10-CM | POA: Diagnosis present

## 2017-07-10 DIAGNOSIS — Z79899 Other long term (current) drug therapy: Secondary | ICD-10-CM | POA: Diagnosis not present

## 2017-07-10 DIAGNOSIS — Z7982 Long term (current) use of aspirin: Secondary | ICD-10-CM | POA: Diagnosis not present

## 2017-07-10 DIAGNOSIS — I471 Supraventricular tachycardia: Secondary | ICD-10-CM | POA: Diagnosis not present

## 2017-07-10 LAB — CBG MONITORING, ED: Glucose-Capillary: 214 mg/dL — ABNORMAL HIGH (ref 65–99)

## 2017-07-10 LAB — COMPREHENSIVE METABOLIC PANEL
ALT: 26 U/L (ref 14–54)
AST: 27 U/L (ref 15–41)
Albumin: 4.5 g/dL (ref 3.5–5.0)
Alkaline Phosphatase: 102 U/L (ref 38–126)
Anion gap: 16 — ABNORMAL HIGH (ref 5–15)
BUN: 17 mg/dL (ref 6–20)
CHLORIDE: 100 mmol/L — AB (ref 101–111)
CO2: 19 mmol/L — AB (ref 22–32)
CREATININE: 0.79 mg/dL (ref 0.44–1.00)
Calcium: 9.8 mg/dL (ref 8.9–10.3)
GFR calc non Af Amer: >60 mL/min (ref >60)
Glucose, Bld: 210 mg/dL — ABNORMAL HIGH (ref 65–99)
POTASSIUM: 4.3 mmol/L (ref 3.5–5.1)
SODIUM: 135 mmol/L (ref 135–145)
Total Bilirubin: 0.6 mg/dL (ref 0.3–1.2)
Total Protein: 8 g/dL (ref 6.5–8.1)

## 2017-07-10 LAB — CBC WITH DIFFERENTIAL/PLATELET
BASOS ABS: 0.1 10*3/uL (ref 0.0–0.1)
BASOS PCT: 0 %
Eosinophils Absolute: 0.3 10*3/uL (ref 0.0–0.7)
Eosinophils Relative: 2 %
HEMATOCRIT: 48.6 % — AB (ref 36.0–46.0)
HEMOGLOBIN: 16.5 g/dL — AB (ref 12.0–15.0)
Lymphocytes Relative: 25 %
Lymphs Abs: 4 10*3/uL (ref 0.7–4.0)
MCH: 30.6 pg (ref 26.0–34.0)
MCHC: 34 g/dL (ref 30.0–36.0)
MCV: 90 fL (ref 78.0–100.0)
MONOS PCT: 4 %
Monocytes Absolute: 0.7 10*3/uL (ref 0.1–1.0)
NEUTROS ABS: 11.2 10*3/uL — AB (ref 1.7–7.7)
NEUTROS PCT: 69 %
Platelets: 265 10*3/uL (ref 150–400)
RBC: 5.4 MIL/uL — AB (ref 3.87–5.11)
RDW: 13.3 % (ref 11.5–15.5)
WBC: 16.2 10*3/uL — AB (ref 4.0–10.5)

## 2017-07-10 LAB — PROTIME-INR
INR: 0.94
PROTHROMBIN TIME: 12.5 s (ref 11.4–15.2)

## 2017-07-10 LAB — I-STAT TROPONIN, ED: TROPONIN I, POC: 0 ng/mL (ref 0.00–0.08)

## 2017-07-10 LAB — BRAIN NATRIURETIC PEPTIDE: B NATRIURETIC PEPTIDE 5: 48 pg/mL (ref 0.0–100.0)

## 2017-07-10 LAB — TROPONIN I

## 2017-07-10 LAB — MAGNESIUM: Magnesium: 1.8 mg/dL (ref 1.7–2.4)

## 2017-07-10 MED ORDER — DIGOXIN 250 MCG PO TABS: 0.2500 mg | ORAL_TABLET | Freq: Every day | ORAL | 0 refills | Status: DC

## 2017-07-10 MED ORDER — ADENOSINE 6 MG/2ML IV SOLN
6.0000 mg | Freq: Once | INTRAVENOUS | Status: AC
  Administered 2017-07-10: 6 mg via INTRAVENOUS
  Filled 2017-07-10: qty 2

## 2017-07-10 MED ORDER — ADENOSINE 6 MG/2ML IV SOLN
INTRAVENOUS | Status: AC
  Filled 2017-07-10: qty 4

## 2017-07-10 MED ORDER — ADENOSINE 6 MG/2ML IV SOLN
INTRAVENOUS | Status: AC
  Administered 2017-07-10: 12 mg
  Filled 2017-07-10: qty 4

## 2017-07-10 MED ORDER — ASPIRIN 81 MG PO CHEW
324.0000 mg | CHEWABLE_TABLET | Freq: Once | ORAL | Status: AC
  Administered 2017-07-10: 324 mg via ORAL
  Filled 2017-07-10: qty 4

## 2017-07-10 NOTE — Discharge Instructions (Signed)
As discussed, with your arrhythmia, SVT, it is very important he follow-up with our cardiology colleagues. The electrophysiology department is aware of today's episode, and should contact you in the coming days to schedule follow-up. If they do not, please be sure to use the contact information above to schedule an appointment. In the interval, monitor your condition carefully, and do not hesitate to return here for concerning changes in your condition.

## 2017-07-10 NOTE — ED Provider Notes (Signed)
[facility] EMERGENCY DEPARTMENT Provider Note   CSN: [id number] Arrival date & time: 07/10/17  1526     History   Chief Complaint Chief Complaint  Patient presents with  . Chest Pain  . Shortness of Breath    HPI Faith Blair is a 56 y.o. female.  HPI 6:19 PM  Patient presents concern of septations, weakness, dyspnea. She notes that over the past few months she has had multiple prior similar episodes, though they are typically shorter, not as severe. Today, she was having a typical episode when she went for her first evaluation at a cardiology rehab center. She has no diagnosis of arrhythmia, nor ischemic disease. After arriving there, being assessed, and found to have tachycardia, she was sent here for evaluation. She states that she is generally well aside from these ongoing episodes of palpitations. No recent medication change, diet change, activity change. Patient does smoke, though she notes she is trying to quit. She is here with her husband who assists with the HPI. Past Medical History:  Diagnosis Date  . Multilevel degenerative disc disease   . Sciatic nerve disease     Patient Active Problem List   Diagnosis Date Noted  . Diabetes mellitus type 2, diet-controlled (HCC) 11/24/2015  . Cigarette nicotine dependence without complication 11/24/2015  . Type 2 diabetes mellitus without complication (HCC) 08/27/2015  . Hyperlipidemia 08/27/2015  . Essential hypertension, benign 07/29/2015  . Cigarette nicotine dependence with nicotine-induced disorder 07/29/2015    Past Surgical History:  Procedure Laterality Date  . CERVICAL FUSION  2011  . DILATION AND CURETTAGE OF UTERUS      OB History    No data available       Home Medications    Prior to Admission medications   Medication Sig Start Date End Date Taking? Authorizing Provider  aspirin EC 81 MG tablet Take 81 mg by mouth daily.    [provider]  ASTAXANTHIN PO Take 12 mg by mouth  daily.    [provider]  Barberry-Oreg Grape-Goldenseal (BERBERINE COMPLEX PO) Take 1 tablet by mouth 2 (two) times daily.    [provider]  colesevelam (WELCHOL) 625 MG tablet Take 3 tablets (1,875 mg total) by mouth 2 (two) times daily with a meal. Patient not taking: Reported on 02/24/2017 11/22/15   Jacquelin Hawking, PA-C  cyclobenzaprine (FLEXERIL) 10 MG tablet TAKE 1 Tablet BY MOUTH 3 TIMES DAILY AS NEEDED FOR MUSCLE SPASMS Patient not taking: Reported on 06/09/2017 05/19/17   Jacquelin Hawking, PA-C  Fluticasone Propionate (FLONASE ALLERGY RELIEF NA) Place 2 sprays into the nose daily. Into in each nostril    [provider]  IBU 800 MG tablet TAKE 1 Tablet  BY MOUTH EVERY 8 HOURS AS NEEDED 05/11/17   Jacquelin Hawking, PA-C  Icosapent Ethyl (VASCEPA) 1 g CAPS Take 1 capsule by mouth 2 (two) times daily. Patient not taking: Reported on 06/23/2017 06/09/17   Jacquelin Hawking, PA-C  loratadine (CLARITIN) 10 MG tablet Take 10 mg by mouth daily as needed for allergies.    [provider]  methocarbamol (ROBAXIN) 500 MG tablet Take 1 tablet (500 mg total) by mouth every 8 (eight) hours as needed for muscle spasms. Patient not taking: Reported on 06/09/2017 04/07/17   Jacquelin Hawking, PA-C  metoprolol tartrate (LOPRESSOR) 25 MG tablet Take 1 tablet (25 mg total) by mouth 2 (two) times daily. 06/09/17   Jacquelin Hawking, PA-C    Family History Family History  Problem Relation Age  of Onset  . Alzheimer's disease Mother     Social History Social History   Tobacco Use  . Smoking status: Current Every Day Smoker    Packs/day: 0.50    Years: 38.00    Pack years: 19.00    Types: Cigarettes  . Smokeless tobacco: Never Used  Substance Use Topics  . Alcohol use: No  . Drug use: No     Allergies   Bee venom; Augmentin [amoxicillin-pot clavulanate]; Ciprofloxacin; Codeine; Floxin [ofloxacin]; Levaquin [levofloxacin]; and Sulfa antibiotics   Review of  Systems Review of Systems  Constitutional:       Per HPI, otherwise negative  HENT:       Per HPI, otherwise negative  Respiratory:       Per HPI, otherwise negative  Cardiovascular:       Per HPI, otherwise negative  Gastrointestinal: Negative for vomiting.  Endocrine:       Negative aside from HPI  Genitourinary:       Neg aside from HPI   Musculoskeletal:       Per HPI, otherwise negative  Skin: Negative.   Neurological: Positive for weakness and light-headedness. Negative for syncope.     Physical Exam Updated Vital Signs BP 110/74 (BP Location: Left Arm)   Pulse (!) 127   Resp 15   SpO2 95%   Physical Exam  Constitutional: She is oriented to person, place, and time. She appears well-developed and well-nourished. No distress.  HENT:  Head: Normocephalic and atraumatic.  Eyes: Conjunctivae and EOM are normal.  Cardiovascular: Regular rhythm. Tachycardia present.  Pulmonary/Chest: Effort normal and breath sounds normal. No stridor. No respiratory distress.  Abdominal: She exhibits no distension.  Musculoskeletal: She exhibits no edema.  Neurological: She is alert and oriented to person, place, and time. No cranial nerve deficit.  Skin: Skin is warm and dry.  Psychiatric: She has a normal mood and affect.  Nursing note and vitals reviewed.    ED Treatments / Results  Labs (all labs ordered are listed, but only abnormal results are displayed) Labs Reviewed  COMPREHENSIVE METABOLIC PANEL - Abnormal; Notable for the following components:      Result Value   Chloride 100 (*)    CO2 19 (*)    Glucose, Bld 210 (*)    Anion gap 16 (*)    All other components within normal limits  CBC WITH DIFFERENTIAL/PLATELET - Abnormal; Notable for the following components:   WBC 16.2 (*)    RBC 5.40 (*)    Hemoglobin 16.5 (*)    HCT 48.6 (*)    Neutro Abs 11.2 (*)    All other components within normal limits  CBG MONITORING, ED - Abnormal; Notable for the following  components:   Glucose-Capillary 214 (*)    All other components within normal limits  MAGNESIUM  TROPONIN I  BRAIN NATRIURETIC PEPTIDE  PROTIME-INR  I-STAT TROPONIN, ED    EKG #1 - on arrival   EKG Interpretation  Date/Time:  Friday July 10 2017 15:45:48 EST Ventricular Rate:  242 PR Interval:    QRS Duration: 75 QT Interval:  231 QTC Calculation: 464 R Axis:   -55 Text Interpretation:  Supraventricular tachycardia Left anterior fascicular block Consider anterior infarct Baseline wander in lead(s) I II aVR aVL abn Confirmed by Gerhard MunchLockwood, Cordae Mccarey [phone]([id number]) on 07/10/2017 4:11:30 PM        EKG Interpretation  Date/Time:  Friday July 10 2017 16:13:09 EST Ventricular Rate:  124 PR Interval:  QRS Duration: 92 QT Interval:  380 QTC Calculation: 546 R Axis:   -20 Text Interpretation:  junctional or sinus tachycardia w low amplituse p-waves. Artifact T wave abnormality Abnormal ekg Confirmed by Gerhard MunchLockwood, Haakon Titsworth [phone]([id number]) on 07/10/2017 4:15:59 PM        Radiology Dg Chest Portable 1 View  Result Date: 07/10/2017 CLINICAL DATA:  Chest pain EXAM: PORTABLE CHEST 1 VIEW COMPARISON:  None. FINDINGS: Heart is mildly enlarged. No confluent airspace opacities, effusions or edema. No acute bony abnormality. IMPRESSION: Cardiomegaly.  No active disease. Electronically Signed   By: Charlett NoseKevin  Dover M.D.   On: 07/10/2017 16:18    Procedures .Cardioversion Date/Time: 07/10/2017 4:19 PM Performed by: Gerhard MunchLockwood, Blinda Turek, MD Authorized by: Gerhard MunchLockwood, Aizlyn Schifano, MD   Consent:    Consent obtained:  Verbal   Consent given by:  Patient   Risks discussed:  Death, induced arrhythmia and pain   Alternatives discussed:  Alternative treatment, rate-control medication, no treatment and delayed treatment Pre-procedure details:    Cardioversion basis:  Emergent   Rhythm:  Supraventricular tachycardia   Electrode placement:  Anterior-lateral Patient sedated: No Attempt one:    Cardioversion mode  attempt one: adenosine, 6mg , 6mg , 12mg , 12mg .   Shock outcome:  Conversion to other rhythm Post-procedure details:    Patient status:  Awake   Patient tolerance of procedure:  Tolerated well, no immediate complications Comments:     After for individual boluses of adenosine the patient had conversion to a narrow complex tachycardia. Tachycardia was initially in the 1 6 0/1 5 0 range, though she began to slow down subsequently.      (including critical care time)  Medications Ordered in ED Medications  aspirin chewable tablet 324 mg (not administered)  adenosine (ADENOCARD) 6 MG/2ML injection 6 mg (6 mg Intravenous Given 07/10/17 1556)  adenosine (ADENOCARD) 6 MG/2ML injection 6 mg (6 mg Intravenous Given 07/10/17 1559)  adenosine (ADENOCARD) 6 MG/2ML injection (12 mg  Given 07/10/17 1603)     Initial Impression / Assessment and Plan / ED Course  I have reviewed the triage vital signs and the nursing notes.  Pertinent labs & imaging results that were available during my care of the patient were reviewed by me and considered in my medical decision making (see chart for details).   Immediately after the initial evaluation with concern for SVT the patient and I discussed risks and benefits of cardioversion with adenosine, and alternatives.  1 patient was amenable to this intervention.   4:15 PM Following cardioversion with adenosine, the patient is much more calm. Symptoms have improved as well.  6:20 PM Patient has mild persistent tachycardia, but no complaints. I discussed the patient's episode of SVT with our cardiologist on call. Given the patient's current use of beta-blocker, though with breakthrough lesions, patient will start digoxin. We discussed referral so that electrophysiology will contact the patient, were current in the clinic for consideration of possible ablation.  On repeat exam patient is awake and alert, has no new complaints, no new questions. She is  amenable to outpatient follow-up, was discharged in stable condition. Final Clinical Impressions(s) / ED Diagnoses  SVT   Gerhard MunchLockwood, Danyeal Akens, MD 07/10/17 1821

## 2017-07-10 NOTE — ED Triage Notes (Signed)
Brought from cardiac rehab for chest pain  Pt clutching her chest,reports palpitations Smoker of 1/2 PPD

## 2017-07-10 NOTE — ED Notes (Signed)
HR decreasing to 126.

## 2017-07-15 DIAGNOSIS — G5700 Lesion of sciatic nerve, unspecified lower limb: Secondary | ICD-10-CM | POA: Insufficient documentation

## 2017-07-15 DIAGNOSIS — M539 Dorsopathy, unspecified: Secondary | ICD-10-CM | POA: Insufficient documentation

## 2017-07-31 ENCOUNTER — Encounter: Payer: Self-pay | Admitting: Internal Medicine

## 2017-07-31 ENCOUNTER — Ambulatory Visit (INDEPENDENT_AMBULATORY_CARE_PROVIDER_SITE_OTHER): Payer: Medicaid Other | Admitting: Internal Medicine

## 2017-07-31 VITALS — BP 124/84 | HR 119 | Ht 66.0 in | Wt 159.4 lb

## 2017-07-31 DIAGNOSIS — I471 Supraventricular tachycardia: Secondary | ICD-10-CM | POA: Diagnosis not present

## 2017-07-31 NOTE — Patient Instructions (Addendum)
Medication Instructions:  Your physician recommends that you continue on your current medications as directed. Please refer to the Current Medication list given to you today.   Labwork: Your physician recommends that you return for lab work BMP/CBC/TSH/T4   Testing/Procedures:  Your physician has recommended that you have an ablation. Catheter ablation is a medical procedure used to treat some cardiac arrhythmias (irregular heartbeats). During catheter ablation, a long, thin, flexible tube is put into a blood vessel in your groin (upper thigh), or neck. This tube is called an ablation catheter. It is then guided to your heart through the blood vessel. Radio frequency waves destroy small areas of heart tissue where abnormal heartbeats may cause an arrhythmia to start. Please see the instruction sheet given to you today.---08/06/17  Please arrive at [facility] at 11:00am Do not eat or drink after midnight the night prior to the procedure Do not take any medications the morning of the test Plan for one night stay Will need someone to drive you home at discharge      Follow-Up: Your physician recommends that you schedule a follow-up appointment in: 4 weeks from 08/06/17 with Dr Johney Frame    Cardiac Ablation Cardiac ablation is a procedure to disable (ablate) a small amount of heart tissue in very specific places. The heart has many electrical connections. Sometimes these connections are abnormal and can cause the heart to beat very fast or irregularly. Ablating some of the problem areas can improve the heart rhythm or return it to normal. Ablation may be done for people who:  Have Wolff-Parkinson-White syndrome.  Have fast heart rhythms (tachycardia).  Have taken medicines for an abnormal heart rhythm (arrhythmia) that were not effective or caused side effects.  Have a high-risk heartbeat that may be life-threatening.  During the procedure, a  small incision is made in the neck or the groin, and a long, thin, flexible tube (catheter) is inserted into the incision and moved to the heart. Small devices (electrodes) on the tip of the catheter will send out electrical currents. A type of X-ray (fluoroscopy) will be used to help guide the catheter and to provide images of the heart. Tell a health care provider about:  Any allergies you have.  All medicines you are taking, including vitamins, herbs, eye drops, creams, and over-the-counter medicines.  Any problems you or family members have had with anesthetic medicines.  Any blood disorders you have.  Any surgeries you have had.  Any medical conditions you have, such as kidney failure.  Whether you are pregnant or may be pregnant. What are the risks? Generally, this is a safe procedure. However, problems may occur, including:  Infection.  Bruising and bleeding at the catheter insertion site.  Bleeding into the chest, especially into the sac that surrounds the heart. This is a serious complication.  Stroke or blood clots.  Damage to other structures or organs.  Allergic reaction to medicines or dyes.  Need for a permanent pacemaker if the normal electrical system is damaged. A pacemaker is a small computer that sends electrical signals to the heart and helps your heart beat normally.  The procedure not being fully effective. This may not be recognized until months later. Repeat ablation procedures are sometimes required.  What happens before the procedure?  Follow instructions from your health care provider about eating or drinking restrictions.  Ask your health care provider about: ? Changing or stopping your regular medicines. This is especially  important if you are taking diabetes medicines or blood thinners. ? Taking medicines such as aspirin and ibuprofen. These medicines can thin your blood. Do not take these medicines before your procedure if your health care  provider instructs you not to.  Plan to have someone take you home from the hospital or clinic.  If you will be going home right after the procedure, plan to have someone with you for 24 hours. What happens during the procedure?  To lower your risk of infection: ? Your health care team will wash or sanitize their hands. ? Your skin will be washed with soap. ? Hair may be removed from the incision area.  An IV tube will be inserted into one of your veins.  You will be given a medicine to help you relax (sedative).  The skin on your neck or groin will be numbed.  An incision will be made in your neck or your groin.  A needle will be inserted through the incision and into a large vein in your neck or groin.  A catheter will be inserted into the needle and moved to your heart.  Dye may be injected through the catheter to help your surgeon see the area of the heart that needs treatment.  Electrical currents will be sent from the catheter to ablate heart tissue in desired areas. There are three types of energy that may be used to ablate heart tissue: ? Heat (radiofrequency energy). ? Laser energy. ? Extreme cold (cryoablation).  When the necessary tissue has been ablated, the catheter will be removed.  Pressure will be held on the catheter insertion area to prevent excessive bleeding.  A bandage (dressing) will be placed over the catheter insertion area. The procedure may vary among health care providers and hospitals. What happens after the procedure?  Your blood pressure, heart rate, breathing rate, and blood oxygen level will be monitored until the medicines you were given have worn off.  Your catheter insertion area will be monitored for bleeding. You will need to lie still for a few hours to ensure that you do not bleed from the catheter insertion area.  Do not drive for 24 hours or as long as directed by your health care provider. Summary  Cardiac ablation is a procedure  to disable (ablate) a small amount of heart tissue in very specific places. Ablating some of the problem areas can improve the heart rhythm or return it to normal.  During the procedure, electrical currents will be sent from the catheter to ablate heart tissue in desired areas. This information is not intended to replace advice given to you by your health care provider. Make sure you discuss any questions you have with your health care provider. Document Released: 11/23/2008 Document Revised: 05/26/2016 Document Reviewed: 05/26/2016 Elsevier Interactive Patient Education  [employer]2018 [employer] Inc[employer]

## 2017-07-31 NOTE — Progress Notes (Signed)
Electrophysiology Office Note   Date:  07/31/2017   ID:  Faith Blair, DOB 08/22/1960, MRN [id number]  PCP:  Toma Deiters, MD    Primary Electrophysiologist: Hillis Range, MD    CC: tachypalpitations   History of Present Illness: Faith Blair is a 57 y.o. female who presents today for electrophysiology evaluation.   Hs presents on referral by Dr Olena Leatherwood for EP consultation regarding tachycardia.  She reports having tachypalpitations "for months".  She finds that she has presyncope with exertion.  She recently presented to the ED and was found to have SVT.  She was placed on metoprolol.  She reports that with higher doses of metoprolol that she has presyncope.    Today, she denies symptoms of chest pain, shortness of breath, orthopnea, PND, lower extremity edema, claudication, , bleeding, or neurologic sequela. The patient is tolerating medications without difficulties and is otherwise without complaint today.    Past Medical History:  Diagnosis Date  . Multilevel degenerative disc disease   . Sciatic nerve disease    Past Surgical History:  Procedure Laterality Date  . CERVICAL FUSION  2011  . DILATION AND CURETTAGE OF UTERUS       Current Outpatient Medications  Medication Sig Dispense Refill  . aspirin EC 81 MG tablet Take 81 mg by mouth daily.    . ASTAXANTHIN PO Take 12 mg by mouth daily.    Claris Gower Grape-Goldenseal (BERBERINE COMPLEX PO) Take 1 tablet by mouth 2 (two) times daily.    . Fluticasone Propionate (FLONASE ALLERGY RELIEF NA) Place 2 sprays into the nose daily. Into in each nostril    . IBU 800 MG tablet TAKE 1 Tablet  BY MOUTH EVERY 8 HOURS AS NEEDED 90 tablet 1  . loratadine (CLARITIN) 10 MG tablet Take 10 mg by mouth daily as needed for allergies.    Marland Kitchen LORazepam (ATIVAN) 0.5 MG tablet Take 0.5 mg by mouth at bedtime as needed (as directed).    . metoprolol tartrate (LOPRESSOR) 25 MG tablet Take 25 mg by mouth 4 (four) times daily.     No current  facility-administered medications for this visit.     Allergies:   Bee venom; Augmentin [amoxicillin-pot clavulanate]; Ciprofloxacin; Codeine; Digoxin and related; Floxin [ofloxacin]; Levaquin [levofloxacin]; and Sulfa antibiotics   Social History:  The patient  reports that she has been smoking cigarettes.  She has a 19.00 pack-year smoking history. she has never used smokeless tobacco. She reports that she does not drink alcohol or use drugs.   Family History:  The patient's  family history includes Alzheimer's disease in her mother.    ROS:  Please see the history of present illness.   All other systems are personally reviewed and negative.    PHYSICAL EXAM: VS:  BP 124/84   Pulse (!) 119   Ht 5\' 6"  (1.676 m)   Wt 159 lb 6.4 oz (72.3 kg)   SpO2 98%   BMI 25.73 kg/m  , BMI Body mass index is 25.73 kg/m. GEN: Well nourished, well developed, in no acute distress  HEENT: normal  Neck: no JVD, carotid bruits, or masses Cardiac: tachycardic irregular rhythm; no murmurs, rubs, or gallops,no edema  Respiratory:  clear to auscultation bilaterally, normal work of breathing GI: soft, nontender, nondistended, + BS MS: no deformity or atrophy  Skin: warm and dry  Neuro:  Strength and sensation are intact Psych: euthymic mood, full affect  EKG:  EKG is ordered today. The ekg ordered today  is personally reviewed and shows atrial flutter (atypical) 119 bpm, PR 212 msec, LAD   Recent Labs: 06/09/2017: TSH 0.946 07/10/2017: ALT 26; B Natriuretic Peptide 48.0; BUN 17; Creatinine, Ser 0.79; Hemoglobin 16.5; Magnesium 1.8; Platelets 265; Potassium 4.3; Sodium 135  personally reviewed   Lipid Panel     Component Value Date/Time   CHOL 264 (H) 02/23/2017 0944   TRIG 365 (H) 02/23/2017 0944   HDL 48 02/23/2017 0944   CHOLHDL 5.5 02/23/2017 0944   VLDL 73 (H) 02/23/2017 0944   LDLCALC 143 (H) 02/23/2017 0944   personally reviewed   Wt Readings from Last 3 Encounters:  07/31/17 159 lb  6.4 oz (72.3 kg)  06/23/17 157 lb (71.2 kg)  06/09/17 158 lb 4 oz (71.8 kg)      Other studies personally reviewed: Additional studies/ records that were reviewed today include: recent ER notes, prior echo  Review of the above records today demonstrates: as above   ASSESSMENT AND PLAN:  1.  SVT I suspect that she may have an ectopic atrial tachycardia or atypical atrial flutter.  She is quite symptomatic. She has not tolerated medical therapy. Therapeutic strategies for supraventricular tachycardia including medicine and ablation were discussed in detail with the patient today. Risk, benefits, and alternatives to EP study and radiofrequency ablation were also discussed in detail today. These risks include but are not limited to stroke, bleeding, vascular damage, tamponade, perforation, damage to the heart and other structures, AV block requiring pacemaker, worsening renal function, and death. The patient understands these risk and wishes to proceed.  We will therefore proceed with catheter ablation at the next available time.  Carto and anesthesia are requested for this procedure.  No changes at this time. Given presyncope, I have advised that she not drive.   Current medicines are reviewed at length with the patient today.   The patient does not have concerns regarding her medicines.  The following changes were made today:  none   Signed, Hillis RangeJames Demia Viera, MD  07/31/2017 3:01 PM     [facility] [address] [place] [id number] [phone] (office) [phone] (fax)

## 2017-07-31 NOTE — H&P (View-Only) (Signed)
Electrophysiology Office Note   Date:  07/31/2017   ID:  Karmen Altamirano, DOB 08/22/1960, MRN [id number]  PCP:  Toma Deiters, MD    Primary Electrophysiologist: Hillis Range, MD    CC: tachypalpitations   History of Present Illness: Faith Blair is a 57 y.o. female who presents today for electrophysiology evaluation.   Hs presents on referral by Dr Olena Leatherwood for EP consultation regarding tachycardia.  She reports having tachypalpitations "for months".  She finds that she has presyncope with exertion.  She recently presented to the ED and was found to have SVT.  She was placed on metoprolol.  She reports that with higher doses of metoprolol that she has presyncope.    Today, she denies symptoms of chest pain, shortness of breath, orthopnea, PND, lower extremity edema, claudication, , bleeding, or neurologic sequela. The patient is tolerating medications without difficulties and is otherwise without complaint today.    Past Medical History:  Diagnosis Date  . Multilevel degenerative disc disease   . Sciatic nerve disease    Past Surgical History:  Procedure Laterality Date  . CERVICAL FUSION  2011  . DILATION AND CURETTAGE OF UTERUS       Current Outpatient Medications  Medication Sig Dispense Refill  . aspirin EC 81 MG tablet Take 81 mg by mouth daily.    . ASTAXANTHIN PO Take 12 mg by mouth daily.    Claris Gower Grape-Goldenseal (BERBERINE COMPLEX PO) Take 1 tablet by mouth 2 (two) times daily.    . Fluticasone Propionate (FLONASE ALLERGY RELIEF NA) Place 2 sprays into the nose daily. Into in each nostril    . IBU 800 MG tablet TAKE 1 Tablet  BY MOUTH EVERY 8 HOURS AS NEEDED 90 tablet 1  . loratadine (CLARITIN) 10 MG tablet Take 10 mg by mouth daily as needed for allergies.    Marland Kitchen LORazepam (ATIVAN) 0.5 MG tablet Take 0.5 mg by mouth at bedtime as needed (as directed).    . metoprolol tartrate (LOPRESSOR) 25 MG tablet Take 25 mg by mouth 4 (four) times daily.     No current  facility-administered medications for this visit.     Allergies:   Bee venom; Augmentin [amoxicillin-pot clavulanate]; Ciprofloxacin; Codeine; Digoxin and related; Floxin [ofloxacin]; Levaquin [levofloxacin]; and Sulfa antibiotics   Social History:  The patient  reports that she has been smoking cigarettes.  She has a 19.00 pack-year smoking history. she has never used smokeless tobacco. She reports that she does not drink alcohol or use drugs.   Family History:  The patient's  family history includes Alzheimer's disease in her mother.    ROS:  Please see the history of present illness.   All other systems are personally reviewed and negative.    PHYSICAL EXAM: VS:  BP 124/84   Pulse (!) 119   Ht 5\' 6"  (1.676 m)   Wt 159 lb 6.4 oz (72.3 kg)   SpO2 98%   BMI 25.73 kg/m  , BMI Body mass index is 25.73 kg/m. GEN: Well nourished, well developed, in no acute distress  HEENT: normal  Neck: no JVD, carotid bruits, or masses Cardiac: tachycardic irregular rhythm; no murmurs, rubs, or gallops,no edema  Respiratory:  clear to auscultation bilaterally, normal work of breathing GI: soft, nontender, nondistended, + BS MS: no deformity or atrophy  Skin: warm and dry  Neuro:  Strength and sensation are intact Psych: euthymic mood, full affect  EKG:  EKG is ordered today. The ekg ordered today  is personally reviewed and shows atrial flutter (atypical) 119 bpm, PR 212 msec, LAD   Recent Labs: 06/09/2017: TSH 0.946 07/10/2017: ALT 26; B Natriuretic Peptide 48.0; BUN 17; Creatinine, Ser 0.79; Hemoglobin 16.5; Magnesium 1.8; Platelets 265; Potassium 4.3; Sodium 135  personally reviewed   Lipid Panel     Component Value Date/Time   CHOL 264 (H) 02/23/2017 0944   TRIG 365 (H) 02/23/2017 0944   HDL 48 02/23/2017 0944   CHOLHDL 5.5 02/23/2017 0944   VLDL 73 (H) 02/23/2017 0944   LDLCALC 143 (H) 02/23/2017 0944   personally reviewed   Wt Readings from Last 3 Encounters:  07/31/17 159 lb  6.4 oz (72.3 kg)  06/23/17 157 lb (71.2 kg)  06/09/17 158 lb 4 oz (71.8 kg)      Other studies personally reviewed: Additional studies/ records that were reviewed today include: recent ER notes, prior echo  Review of the above records today demonstrates: as above   ASSESSMENT AND PLAN:  1.  SVT I suspect that she may have an ectopic atrial tachycardia or atypical atrial flutter.  She is quite symptomatic. She has not tolerated medical therapy. Therapeutic strategies for supraventricular tachycardia including medicine and ablation were discussed in detail with the patient today. Risk, benefits, and alternatives to EP study and radiofrequency ablation were also discussed in detail today. These risks include but are not limited to stroke, bleeding, vascular damage, tamponade, perforation, damage to the heart and other structures, AV block requiring pacemaker, worsening renal function, and death. The patient understands these risk and wishes to proceed.  We will therefore proceed with catheter ablation at the next available time.  Carto and anesthesia are requested for this procedure.  No changes at this time. Given presyncope, I have advised that she not drive.   Current medicines are reviewed at length with the patient today.   The patient does not have concerns regarding her medicines.  The following changes were made today:  none   Signed, Hillis RangeJames Brette Cast, MD  07/31/2017 3:01 PM     [facility] [address] [place] [address] [phone] (office) [phone] (fax)

## 2017-08-01 LAB — CBC WITH DIFFERENTIAL/PLATELET
BASOS: 0 %
Basophils Absolute: 0 10*3/uL (ref 0.0–0.2)
EOS (ABSOLUTE): 0.2 10*3/uL (ref 0.0–0.4)
EOS: 1 %
HEMATOCRIT: 44.6 % (ref 34.0–46.6)
HEMOGLOBIN: 15.3 g/dL (ref 11.1–15.9)
IMMATURE GRANS (ABS): 0 10*3/uL (ref 0.0–0.1)
IMMATURE GRANULOCYTES: 0 %
Lymphocytes Absolute: 3.8 10*3/uL — ABNORMAL HIGH (ref 0.7–3.1)
Lymphs: 24 %
MCH: 30.5 pg (ref 26.6–33.0)
MCHC: 34.3 g/dL (ref 31.5–35.7)
MCV: 89 fL (ref 79–97)
MONOS ABS: 0.6 10*3/uL (ref 0.1–0.9)
Monocytes: 4 %
NEUTROS PCT: 71 %
Neutrophils Absolute: 11.5 10*3/uL — ABNORMAL HIGH (ref 1.4–7.0)
Platelets: 249 10*3/uL (ref 150–379)
RBC: 5.02 x10E6/uL (ref 3.77–5.28)
RDW: 14.3 % (ref 12.3–15.4)
WBC: 16.2 10*3/uL — AB (ref 3.4–10.8)

## 2017-08-01 LAB — BASIC METABOLIC PANEL
BUN/Creatinine Ratio: 25 — ABNORMAL HIGH (ref 9–23)
BUN: 16 mg/dL (ref 6–24)
CO2: 22 mmol/L (ref 20–29)
CREATININE: 0.63 mg/dL (ref 0.57–1.00)
Calcium: 10.2 mg/dL (ref 8.7–10.2)
Chloride: 98 mmol/L (ref 96–106)
GFR calc Af Amer: 116 mL/min/{1.73_m2} (ref >59)
GFR, EST NON AFRICAN AMERICAN: 101 mL/min/{1.73_m2} (ref >59)
GLUCOSE: 110 mg/dL — AB (ref 65–99)
Potassium: 4.6 mmol/L (ref 3.5–5.2)
SODIUM: 140 mmol/L (ref 134–144)

## 2017-08-01 LAB — T4, FREE: FREE T4: 1.17 ng/dL (ref 0.82–1.77)

## 2017-08-01 LAB — TSH: TSH: 0.868 u[IU]/mL (ref 0.450–4.500)

## 2017-08-05 ENCOUNTER — Ambulatory Visit: Payer: Medicaid Other | Admitting: Physician Assistant

## 2017-08-06 ENCOUNTER — Ambulatory Visit (HOSPITAL_BASED_OUTPATIENT_CLINIC_OR_DEPARTMENT_OTHER): Payer: Medicaid Other

## 2017-08-06 ENCOUNTER — Encounter (HOSPITAL_COMMUNITY): Payer: Self-pay | Admitting: Anesthesiology

## 2017-08-06 ENCOUNTER — Ambulatory Visit (HOSPITAL_COMMUNITY): Payer: Medicaid Other | Admitting: Anesthesiology

## 2017-08-06 ENCOUNTER — Other Ambulatory Visit: Payer: Self-pay

## 2017-08-06 ENCOUNTER — Ambulatory Visit (HOSPITAL_COMMUNITY)
Admission: RE | Admit: 2017-08-06 | Discharge: 2017-08-06 | Disposition: A | Discharge status: Home / Self Care | Payer: Medicaid Other | Source: Ambulatory Visit | Attending: Internal Medicine | Admitting: Internal Medicine

## 2017-08-06 ENCOUNTER — Encounter (HOSPITAL_COMMUNITY)
Admission: RE | Disposition: A | Discharge status: Home / Self Care | Payer: Self-pay | Source: Ambulatory Visit | Attending: Internal Medicine

## 2017-08-06 DIAGNOSIS — Z79899 Other long term (current) drug therapy: Secondary | ICD-10-CM | POA: Insufficient documentation

## 2017-08-06 DIAGNOSIS — Z882 Allergy status to sulfonamides status: Secondary | ICD-10-CM | POA: Diagnosis not present

## 2017-08-06 DIAGNOSIS — Z880 Allergy status to penicillin: Secondary | ICD-10-CM | POA: Insufficient documentation

## 2017-08-06 DIAGNOSIS — Z91030 Bee allergy status: Secondary | ICD-10-CM | POA: Insufficient documentation

## 2017-08-06 DIAGNOSIS — I471 Supraventricular tachycardia, unspecified: Secondary | ICD-10-CM | POA: Diagnosis present

## 2017-08-06 DIAGNOSIS — I4892 Unspecified atrial flutter: Secondary | ICD-10-CM

## 2017-08-06 DIAGNOSIS — M1990 Unspecified osteoarthritis, unspecified site: Secondary | ICD-10-CM | POA: Diagnosis not present

## 2017-08-06 DIAGNOSIS — F17210 Nicotine dependence, cigarettes, uncomplicated: Secondary | ICD-10-CM | POA: Insufficient documentation

## 2017-08-06 DIAGNOSIS — Z7982 Long term (current) use of aspirin: Secondary | ICD-10-CM | POA: Diagnosis not present

## 2017-08-06 HISTORY — PX: SVT ABLATION: EP1225

## 2017-08-06 HISTORY — PX: TEE WITHOUT CARDIOVERSION: SHX5443

## 2017-08-06 SURGERY — SVT ABLATION
Anesthesia: Monitor Anesthesia Care

## 2017-08-06 MED ORDER — PROPOFOL 500 MG/50ML IV EMUL
INTRAVENOUS | Status: DC | PRN
  Administered 2017-08-06: 75 ug/kg/min via INTRAVENOUS

## 2017-08-06 MED ORDER — HYDROCODONE-ACETAMINOPHEN 5-325 MG PO TABS: 1.0000 | ORAL_TABLET | ORAL | Status: DC | PRN

## 2017-08-06 MED ORDER — SODIUM CHLORIDE 0.9 % IV SOLN
INTRAVENOUS | Status: DC
  Administered 2017-08-06: 11:00:00 via INTRAVENOUS

## 2017-08-06 MED ORDER — SODIUM CHLORIDE 0.9 % IV SOLN: 250.0000 mL | INTRAVENOUS | Status: DC | PRN

## 2017-08-06 MED ORDER — PHENYLEPHRINE 40 MCG/ML (10ML) SYRINGE FOR IV PUSH (FOR BLOOD PRESSURE SUPPORT)
PREFILLED_SYRINGE | INTRAVENOUS | Status: DC | PRN
  Administered 2017-08-06 (×2): 80 ug via INTRAVENOUS
  Administered 2017-08-06: 120 ug via INTRAVENOUS
  Administered 2017-08-06 (×3): 80 ug via INTRAVENOUS

## 2017-08-06 MED ORDER — APIXABAN 5 MG PO TABS
5.0000 mg | ORAL_TABLET | Freq: Once | ORAL | Status: AC
  Administered 2017-08-06: 5 mg via ORAL
  Filled 2017-08-06: qty 1

## 2017-08-06 MED ORDER — HYDROCODONE-ACETAMINOPHEN 5-325 MG PO TABS
1.0000 | ORAL_TABLET | ORAL | Status: DC | PRN
  Administered 2017-08-06: 1 via ORAL
  Filled 2017-08-06: qty 1

## 2017-08-06 MED ORDER — ONDANSETRON HCL 4 MG/2ML IJ SOLN
INTRAMUSCULAR | Status: DC | PRN
  Administered 2017-08-06: 4 mg via INTRAVENOUS

## 2017-08-06 MED ORDER — METOPROLOL TARTRATE 50 MG PO TABS: 25.0000 mg | ORAL_TABLET | Freq: Two times a day (BID) | ORAL | Status: DC

## 2017-08-06 MED ORDER — MIDAZOLAM HCL 5 MG/5ML IJ SOLN
INTRAMUSCULAR | Status: DC | PRN
  Administered 2017-08-06: 2 mg via INTRAVENOUS

## 2017-08-06 MED ORDER — ACETAMINOPHEN 325 MG PO TABS: 650.0000 mg | ORAL_TABLET | ORAL | Status: DC | PRN

## 2017-08-06 MED ORDER — PROPOFOL 10 MG/ML IV BOLUS
INTRAVENOUS | Status: DC | PRN
  Administered 2017-08-06 (×2): 30 mg via INTRAVENOUS
  Administered 2017-08-06: 20 mg via INTRAVENOUS

## 2017-08-06 MED ORDER — ISOPROTERENOL HCL 0.2 MG/ML IJ SOLN
INTRAMUSCULAR | Status: AC
  Filled 2017-08-06: qty 5

## 2017-08-06 MED ORDER — SODIUM CHLORIDE 0.9% FLUSH: 3.0000 mL | INTRAVENOUS | Status: DC | PRN

## 2017-08-06 MED ORDER — APIXABAN 5 MG PO TABS: 5.0000 mg | ORAL_TABLET | Freq: Two times a day (BID) | ORAL | 0 refills | Status: DC

## 2017-08-06 MED ORDER — BUPIVACAINE HCL (PF) 0.25 % IJ SOLN
INTRAMUSCULAR | Status: DC | PRN
  Administered 2017-08-06: 30 mL

## 2017-08-06 MED ORDER — SODIUM CHLORIDE 0.9% FLUSH: 3.0000 mL | Freq: Two times a day (BID) | INTRAVENOUS | Status: DC

## 2017-08-06 MED ORDER — ISOPROTERENOL HCL 0.2 MG/ML IJ SOLN
INTRAVENOUS | Status: DC | PRN
  Administered 2017-08-06: 4 ug/min via INTRAVENOUS

## 2017-08-06 MED ORDER — ONDANSETRON HCL 4 MG/2ML IJ SOLN: 4.0000 mg | Freq: Four times a day (QID) | INTRAMUSCULAR | Status: DC | PRN

## 2017-08-06 MED ORDER — FENTANYL CITRATE (PF) 250 MCG/5ML IJ SOLN
INTRAMUSCULAR | Status: DC | PRN
  Administered 2017-08-06 (×2): 50 ug via INTRAVENOUS

## 2017-08-06 MED ORDER — BUPIVACAINE HCL (PF) 0.25 % IJ SOLN
INTRAMUSCULAR | Status: AC
  Filled 2017-08-06: qty 30

## 2017-08-06 SURGICAL SUPPLY — 12 items
BAG SNAP BAND KOVER 36X36 (MISCELLANEOUS) ×4 IMPLANT
BLANKET WARM UNDERBOD FULL ACC (MISCELLANEOUS) ×4 IMPLANT
CATH EZ STEER NAV 8MM F-J CUR (ABLATOR) ×4 IMPLANT
CATH JOSEPH QUAD ALLRED 6F REP (CATHETERS) ×4 IMPLANT
CATH WEBSTER BI DIR CS D-F CRV (CATHETERS) ×4 IMPLANT
PACK EP LATEX FREE (CUSTOM PROCEDURE TRAY) ×2
PACK EP LF (CUSTOM PROCEDURE TRAY) ×2 IMPLANT
PAD DEFIB LIFELINK (PAD) ×4 IMPLANT
PATCH CARTO3 (PAD) ×4 IMPLANT
SHEATH PINNACLE 6F 10CM (SHEATH) ×4 IMPLANT
SHEATH PINNACLE 7F 10CM (SHEATH) ×4 IMPLANT
SHEATH PINNACLE 8F 10CM (SHEATH) ×4 IMPLANT

## 2017-08-06 NOTE — Progress Notes (Signed)
Per Dr.Allred, r/t ablation done today, bed rest time (which began at 1600) will be completed 4.5 hours later at 2030.  Afterwards, considering that pt is hemodynamically stable, may be discharged.  Pt is a new eliquis pt, and has been given the 30-day free coupon and educated on the importance of taking this medication as prescribed.

## 2017-08-06 NOTE — Interval H&P Note (Signed)
History and Physical Interval Note:  08/06/2017 10:35 AM  Faith Blair  has presented today for surgery, with the diagnosis of svt  The various methods of treatment have been discussed with the patient and family. After consideration of risks, benefits and other options for treatment, the patient has consented to  Procedure(s): SVT ABLATION (N/A) as a surgical intervention .  The patient's history has been reviewed, patient examined, no change in status, stable for surgery.  I have reviewed the patient's chart and labs.  Questions were answered to the patient's satisfaction.   Will plan TEE prior to the procedure as this may be an atypical flutter based on ekg appearance.  If so, she is aware that she would require anticoagulation for 4 weeks post ablation.   Hillis RangeJames Anjalina Bergevin

## 2017-08-06 NOTE — Plan of Care (Signed)
  Education: Knowledge of General Education information will improve 08/06/2017 1656 - Progressing by Loel LoftyLewis, Zandra Lajeunesse P, RN

## 2017-08-06 NOTE — Anesthesia Postprocedure Evaluation (Signed)
Anesthesia Post Note  Patient: Faith Blair  Procedure(s) Performed: SVT ABLATION (N/A ) Transesophageal Echocardiogram Darden Dates)     Patient location during evaluation: Cath Lab Anesthesia Type: MAC Level of consciousness: awake and alert Pain management: pain level controlled Vital Signs Assessment: post-procedure vital signs reviewed and stable Respiratory status: spontaneous breathing, nonlabored ventilation, respiratory function stable and patient connected to nasal cannula oxygen Cardiovascular status: stable and blood pressure returned to baseline Postop Assessment: no apparent nausea or vomiting Anesthetic complications: no    Last Vitals:  Vitals:   08/06/17 1615 08/06/17 1620  BP: 123/74 128/69  Pulse: 87 83  Resp: 18 15  Temp:  36.6 C  SpO2: 94% 96%    Last Pain: There were no vitals filed for this visit.               Barnet Glasgow

## 2017-08-06 NOTE — Anesthesia Procedure Notes (Signed)
Procedure Name: MAC Date/Time: 08/06/2017 1:25 PM Performed by: Jenne Campus, CRNA Pre-anesthesia Checklist: Patient identified, Emergency Drugs available, Suction available and Patient being monitored Oxygen Delivery Method: Simple face mask and Nasal cannula

## 2017-08-06 NOTE — Progress Notes (Signed)
Site area: Right groin a 6,7,and 8 french venous sheath was removed  Site Prior to Removal:  Level 0  Pressure Applied For 30 MINUTES    Bedrest Beginning at 1600p  Manual:   Yes.    Patient Status During Pull:  stable  Post Pull Groin Site:  Level 0  Post Pull Instructions Given:  Yes.    Post Pull Pulses Present:  Yes.    Dressing Applied:  Yes.    Comments:

## 2017-08-06 NOTE — Transfer of Care (Signed)
Immediate Anesthesia Transfer of Care Note  Patient: Faith Blair  Procedure(s) Performed: SVT ABLATION (N/A ) Transesophageal Echocardiogram Darden Dates)  Patient Location: Cath Lab  Anesthesia Type:MAC  Level of Consciousness: awake, oriented and patient cooperative  Airway & Oxygen Therapy: Patient Spontanous Breathing and Patient connected to nasal cannula oxygen  Post-op Assessment: Report given to RN and Post -op Vital signs reviewed and stable  Post vital signs: Reviewed  Last Vitals:  Vitals:   08/06/17 1030  BP: (!) 131/95  Pulse: (!) 123  Resp: 18  Temp: 37.2 C  SpO2: 100%    Last Pain: There were no vitals filed for this visit.       Complications: No apparent anesthesia complications

## 2017-08-06 NOTE — Anesthesia Preprocedure Evaluation (Addendum)
Anesthesia Evaluation  Patient identified by MRN, date of birth, ID band Patient awake    Reviewed: Allergy & Precautions, NPO status , Patient's Chart, lab work & pertinent test results, reviewed documented beta blocker date and time   Airway Mallampati: II  TM Distance: >3 FB Neck ROM: Full    Dental no notable dental hx. (+) Dental Advisory Given   Pulmonary neg pulmonary ROS, Current Smoker,    Pulmonary exam normal breath sounds clear to auscultation       Cardiovascular hypertension, Pt. on medications and Pt. on home beta blockers negative cardio ROS Normal cardiovascular exam Rhythm:Regular Rate:Normal     Neuro/Psych negative neurological ROS  negative psych ROS   GI/Hepatic negative GI ROS, Neg liver ROS,   Endo/Other  negative endocrine ROSdiabetes  Renal/GU negative Renal ROS  negative genitourinary   Musculoskeletal negative musculoskeletal ROS (+) Arthritis ,   Abdominal   Peds  Hematology negative hematology ROS (+)   Anesthesia Other Findings   Reproductive/Obstetrics                            Anesthesia Physical Anesthesia Plan  ASA: III  Anesthesia Plan: MAC   Post-op Pain Management:    Induction: Intravenous  PONV Risk Score and Plan: 2 and Treatment may vary due to age or medical condition, Ondansetron and Dexamethasone  Airway Management Planned: Mask, Natural Airway and Nasal Cannula  Additional Equipment:   Intra-op Plan:   Post-operative Plan:   Informed Consent: I have reviewed the patients History and Physical, chart, labs and discussed the procedure including the risks, benefits and alternatives for the proposed anesthesia with the patient or authorized representative who has indicated his/her understanding and acceptance.   Dental advisory given  Plan Discussed with: CRNA and Anesthesiologist  Anesthesia Plan Comments:          Anesthesia Quick Evaluation

## 2017-08-06 NOTE — Progress Notes (Addendum)
  Echocardiogram Echocardiogram Transesophageal has been performed.  Leta JunglingCooper, Thurza Kwiecinski M 08/06/2017, 2:55 PM

## 2017-08-06 NOTE — Op Note (Signed)
TEE   LA, LA appendage, RA without masses  MV normal  Trace MR AV normal  Trace AI PV normal  Trace PI TV normal  LVEF appears mod to severely depressed   Mild fixed atherosclerotic plaquing of aortic arch    Full report to follow in CV section of chart.

## 2017-08-06 NOTE — Progress Notes (Signed)
Pt maintained an O2 sat of 98-100% while ambulating on room air after pt's bedrest had been completed. Pt stable for discharge.

## 2017-08-06 NOTE — Discharge Instructions (Signed)
No driving for 4 days. No lifting over 5 lbs for 1 week. No sexual activity for 1 week. You may return to work in 1 week. Keep procedure site clean & dry. If you notice increased pain, swelling, bleeding or pus, call/return!  You may shower, but no soaking baths/hot tubs/pools for 1 week.  ° ° °

## 2017-08-07 ENCOUNTER — Telehealth: Payer: Self-pay | Admitting: Internal Medicine

## 2017-08-07 ENCOUNTER — Encounter (HOSPITAL_COMMUNITY): Payer: Self-pay | Admitting: Internal Medicine

## 2017-08-07 ENCOUNTER — Other Ambulatory Visit: Payer: Self-pay | Admitting: *Deleted

## 2017-08-07 MED ORDER — APIXABAN 5 MG PO TABS: 5.0000 mg | ORAL_TABLET | Freq: Two times a day (BID) | ORAL | 0 refills | Status: DC

## 2017-08-07 NOTE — Telephone Encounter (Signed)
New message   Patient states she was given  A discount card for Eliquis. States the pharmacy would not honor the card. Patient does not have insurance. She had ablation on 08/06/17. Requesting to pick up samples of Eliquis at the [place] office, closer to her home. Please call  Patient calling the office for samples of medication:   1.  What medication and dosage are you requesting samples for?apixaban (ELIQUIS) 5 MG TABS tablet  2.  Are you currently out of this medication? Yes

## 2017-08-07 NOTE — Telephone Encounter (Addendum)
The pt is advised that our [facility] has 2 boxes of Eliquis samples that she can come by and pick up. She states that she will send her husband to pick them up as she just got out of the hospital yesterday and wants to rest.  Per her request I have mailed her a [employer] pt assistance application and a 30 day trail offer card as the one she was given at her hospital discharge was for a $10 co-pay which she cannot use because she has no insurance.  The pt thanked me for my help.  I have completed the providers part of the application and placed it in the yellow folder in the PA dept.

## 2017-08-07 NOTE — Telephone Encounter (Signed)
Follow up    Patient calling back does not want to go into the weekend with the Eliquis, needs samples and discount card packet

## 2017-09-07 ENCOUNTER — Ambulatory Visit: Payer: Medicaid Other | Admitting: Internal Medicine

## 2017-09-07 ENCOUNTER — Encounter: Payer: Self-pay | Admitting: Internal Medicine

## 2017-09-07 VITALS — BP 134/84 | HR 92 | Ht 69.0 in | Wt 155.0 lb

## 2017-09-07 DIAGNOSIS — I471 Supraventricular tachycardia: Secondary | ICD-10-CM | POA: Diagnosis not present

## 2017-09-07 NOTE — Progress Notes (Signed)
   PCP: Toma DeitersHasanaj, Xaje A, MD   Primary EP: Dr Leilani MerlAllred  Faith Blair is a 57 y.o. female who presents today for routine electrophysiology followup.  Since last being seen in our clinic, the patient reports doing very well. Denies complications from ablation.  Very pleased with results.  Syncope has stopped.  SOB and fatigue are gone.  Her primary concern today is with chronic pain.  Today, she denies symptoms of palpitations, chest pain, shortness of breath,  lower extremity edema, dizziness, presyncope, or syncope.  The patient is otherwise without complaint today.   Past Medical History:  Diagnosis Date  . Multilevel degenerative disc disease   . Sciatic nerve disease   . SVT (supraventricular tachycardia) (HCC)    Past Surgical History:  Procedure Laterality Date  . CERVICAL FUSION  2011  . DILATION AND CURETTAGE OF UTERUS    . SVT ABLATION N/A 08/06/2017   Procedure: SVT ABLATION;  Surgeon: Hillis RangeAllred, Elizabeht Suto, MD;  Location: [facility] INVASIVE CV LAB;  Service: Cardiovascular;  Laterality: N/A;  . TEE WITHOUT CARDIOVERSION  08/06/2017   Procedure: Transesophageal Echocardiogram (Tee);  Surgeon: Hillis RangeAllred, Dub Maclellan, MD;  [facility];  Service: Cardiovascular;;    ROS- all systems are reviewed and negatives except as per HPI above  Current Outpatient Medications  Medication Sig Dispense Refill  . ASTAXANTHIN PO Take 12 mg by mouth daily.    Claris Gower. Barberry-Oreg Grape-Goldenseal (BERBERINE COMPLEX PO) Take 1 tablet by mouth 2 (two) times daily.    . Coenzyme Q10 (COQ10) 200 MG CAPS Take 200 mg by mouth daily.    . fluticasone (FLONASE) 50 MCG/ACT nasal spray Place 2 sprays into both nostrils daily as needed for allergies or rhinitis.    . IBU 800 MG tablet TAKE 1 Tablet  BY MOUTH EVERY 8 HOURS AS NEEDED (Patient taking differently: Take 400mg  by mouth 3 times daily) 90 tablet 1  . loratadine (CLARITIN) 10 MG tablet Take 10 mg by mouth daily as needed for allergies.    Marland Kitchen. LORazepam (ATIVAN) 0.5  MG tablet Take 0.5 mg by mouth at bedtime as needed for sleep.     . Magnesium 500 MG TABS Take 500 mg by mouth daily.    . Multiple Vitamin (MULTIVITAMIN WITH MINERALS) TABS tablet Take 1 tablet by mouth daily.    . Omega-3 Fatty Acids (FISH OIL) 1000 MG CAPS Take 1,000 mg by mouth 2 (two) times daily.    Marland Kitchen. SPIRULINA PO Take 3,000 mg by mouth.     No current facility-administered medications for this visit.     Physical Exam: Vitals:   09/07/17 1409  BP: 134/84  Pulse: 92  Weight: 155 lb (70.3 kg)  Height: 5\' 9"  (1.753 m)    GEN- The patient is well appearing, alert and oriented x 3 today.   Head- normocephalic, atraumatic Eyes-  Sclera clear, conjunctiva pink Ears- hearing intact Oropharynx- clear Lungs- Clear to ausculation bilaterally, normal work of breathing Heart- Regular rate and rhythm, no murmurs, rubs or gallops, PMI not laterally displaced GI- soft, NT, ND, + BS Extremities- no clubbing, cyanosis, or edema  EKG tracing ordered today is personally reviewed and shows sinus rhythm 92 bpm, poor r wave progression  Assessment and Plan:  1. Ectopic atrial tachycardia Resolved s/p ablation Stop eliquis Stop metoprolol  Return as needed  Hillis RangeJames Porschia Willbanks MD, [facility] 09/07/2017 2:22 PM

## 2017-09-07 NOTE — Patient Instructions (Addendum)
Medication Instructions:  Your physician has recommended you make the following change in your medication:  1.  Stop taking metoprolol 2.  Stop taking Eliquis  Labwork: None ordered.  Testing/Procedures: None ordered.  Follow-Up: Your physician wants you to follow-up in: as needed with Dr. Johney FrameAllred.  Any Other Special Instructions Will Be Listed Below (If Applicable).  If you need a refill on your cardiac medications before your next appointment, please call your pharmacy.

## 2017-09-21 ENCOUNTER — Other Ambulatory Visit: Payer: Self-pay | Admitting: Internal Medicine

## 2017-09-21 DIAGNOSIS — M545 Low back pain, unspecified: Secondary | ICD-10-CM

## 2017-10-02 ENCOUNTER — Other Ambulatory Visit: Payer: Self-pay | Admitting: Neurosurgery

## 2017-10-07 NOTE — Pre-Procedure Instructions (Signed)
Faith Blair  10/07/2017      [facility] [place] - [address] [place] [address] Phone: [phone] Fax: [phone]    Your procedure is scheduled on  Wednesday  10/14/17  [facility] at [facility]30 A.M.  Call this number if you have problems the morning of surgery:  [phone]   Remember:  Do not eat food or drink liquids after midnight.  Take these medicines the morning of surgery with A SIP OF WATER  - tylenol  7 days prior to surgery STOP taking any Aspirin(unless otherwise instructed by your surgeon), Aleve, Naproxen, Ibuprofen, Motrin, Advil, Goody's, BC's, all herbal medications, fish oil, and all vitamins   Do not wear jewelry, make-up or nail polish.  Do not wear lotions, powders, or perfumes, or deodorant.  Do not shave 48 hours prior to surgery.  Men may shave face and neck.  Do not bring valuables to the hospital.  [facility] is not responsible for any belongings or valuables.  Contacts, dentures or bridgework may not be worn into surgery.  Leave your suitcase in the car.  After surgery it may be brought to your room.  For patients admitted to the hospital, discharge time will be determined by your treatment team.  Patients discharged the day of surgery will not be allowed to drive home.   Name and phone number of your driver:    Special instructions:  [place] - Preparing for Surgery  Before surgery, you can play an important role.  Because skin is not sterile, your skin needs to be as free of germs as possible.  You can reduce the number of germs on you skin by washing with CHG (chlorahexidine gluconate) soap before surgery.  CHG is an antiseptic cleaner which kills germs and bonds with the skin to continue killing germs even after washing.  Please DO NOT use if you have an allergy to CHG or antibacterial soaps.  If your skin becomes reddened/irritated stop using the CHG and inform your  nurse when you arrive at Short Stay.  Do not shave (including legs and underarms) for at least 48 hours prior to the first CHG shower.  You may shave your face.  Please follow these instructions carefully:   1.  Shower with CHG Soap the night before surgery and the                                morning of Surgery.  2.  If you choose to wash your hair, wash your hair first as usual with your       normal shampoo.  3.  After you shampoo, rinse your hair and body thoroughly to remove the                      Shampoo.  4.  Use CHG as you would any other liquid soap.  You can apply chg directly       to the skin and wash gently with scrungie or a clean washcloth.  5.  Apply the CHG Soap to your body ONLY FROM THE NECK DOWN.        Do not use on open wounds or open sores.  Avoid contact with your eyes,       ears, mouth and genitals (private parts).  Wash genitals (private parts)  with your normal soap.  6.  Wash thoroughly, paying special attention to the area where your surgery        will be performed.  7.  Thoroughly rinse your body with warm water from the neck down.  8.  DO NOT shower/wash with your normal soap after using and rinsing off       the CHG Soap.  9.  Pat yourself dry with a clean towel.            10.  Wear clean pajamas.            11.  Place clean sheets on your bed the night of your first shower and do not        sleep with pets.  Day of Surgery  Do not apply any lotions/deoderants the morning of surgery.  Please wear clean clothes to the hospital/surgery center.    Please read over the following fact sheets that you were given. MRSA Information and Surgical Site Infection Prevention

## 2017-10-08 ENCOUNTER — Other Ambulatory Visit: Payer: Self-pay

## 2017-10-08 ENCOUNTER — Encounter (HOSPITAL_COMMUNITY)
Admission: RE | Admit: 2017-10-08 | Discharge: 2017-10-08 | Disposition: A | Discharge status: Home / Self Care | Payer: Medicaid Other | Source: Ambulatory Visit | Attending: Neurosurgery | Admitting: Neurosurgery

## 2017-10-08 ENCOUNTER — Encounter (HOSPITAL_COMMUNITY): Payer: Self-pay

## 2017-10-08 DIAGNOSIS — Z01818 Encounter for other preprocedural examination: Secondary | ICD-10-CM | POA: Insufficient documentation

## 2017-10-08 HISTORY — DX: Prediabetes: R73.03

## 2017-10-08 HISTORY — DX: Meningitis, unspecified: G03.9

## 2017-10-08 HISTORY — DX: Other complications of anesthesia, initial encounter: T88.59XA

## 2017-10-08 HISTORY — DX: Cardiac arrhythmia, unspecified: I49.9

## 2017-10-08 HISTORY — DX: Adverse effect of unspecified anesthetic, initial encounter: T41.45XA

## 2017-10-08 HISTORY — DX: Anxiety disorder, unspecified: F41.9

## 2017-10-08 LAB — CBC
HCT: 40.7 % (ref 36.0–46.0)
HEMOGLOBIN: 14.1 g/dL (ref 12.0–15.0)
MCH: 31.1 pg (ref 26.0–34.0)
MCHC: 34.6 g/dL (ref 30.0–36.0)
MCV: 89.6 fL (ref 78.0–100.0)
PLATELETS: 231 10*3/uL (ref 150–400)
RBC: 4.54 MIL/uL (ref 3.87–5.11)
RDW: 13.1 % (ref 11.5–15.5)
WBC: 10.4 10*3/uL (ref 4.0–10.5)

## 2017-10-08 LAB — BASIC METABOLIC PANEL
Anion gap: 11 (ref 5–15)
BUN: 13 mg/dL (ref 6–20)
CALCIUM: 9.6 mg/dL (ref 8.9–10.3)
CHLORIDE: 102 mmol/L (ref 101–111)
CO2: 23 mmol/L (ref 22–32)
CREATININE: 0.66 mg/dL (ref 0.44–1.00)
Glucose, Bld: 117 mg/dL — ABNORMAL HIGH (ref 65–99)
Potassium: 3.9 mmol/L (ref 3.5–5.1)
SODIUM: 136 mmol/L (ref 135–145)

## 2017-10-08 LAB — SURGICAL PCR SCREEN
MRSA, PCR: NEGATIVE
Staphylococcus aureus: POSITIVE — AB

## 2017-10-08 LAB — GLUCOSE, CAPILLARY: Glucose-Capillary: 122 mg/dL — ABNORMAL HIGH (ref 65–99)

## 2017-10-14 ENCOUNTER — Ambulatory Visit (HOSPITAL_COMMUNITY): Admission: RE | Admit: 2017-10-14 | Payer: Medicaid Other | Source: Ambulatory Visit | Admitting: Neurosurgery

## 2017-10-14 ENCOUNTER — Encounter (HOSPITAL_COMMUNITY): Admission: RE | Payer: Self-pay | Source: Ambulatory Visit

## 2017-10-14 SURGERY — LUMBAR LAMINECTOMY/DECOMPRESSION MICRODISCECTOMY 1 LEVEL
Anesthesia: General | Laterality: Right

## 2018-01-19 IMAGING — CR DG CHEST 1V PORT
1 series · 1 of 1 positions shown · non-contrast
Comparison: None.

CLINICAL DATA: Chest pain

EXAM:
PORTABLE CHEST 1 VIEW

[portable]
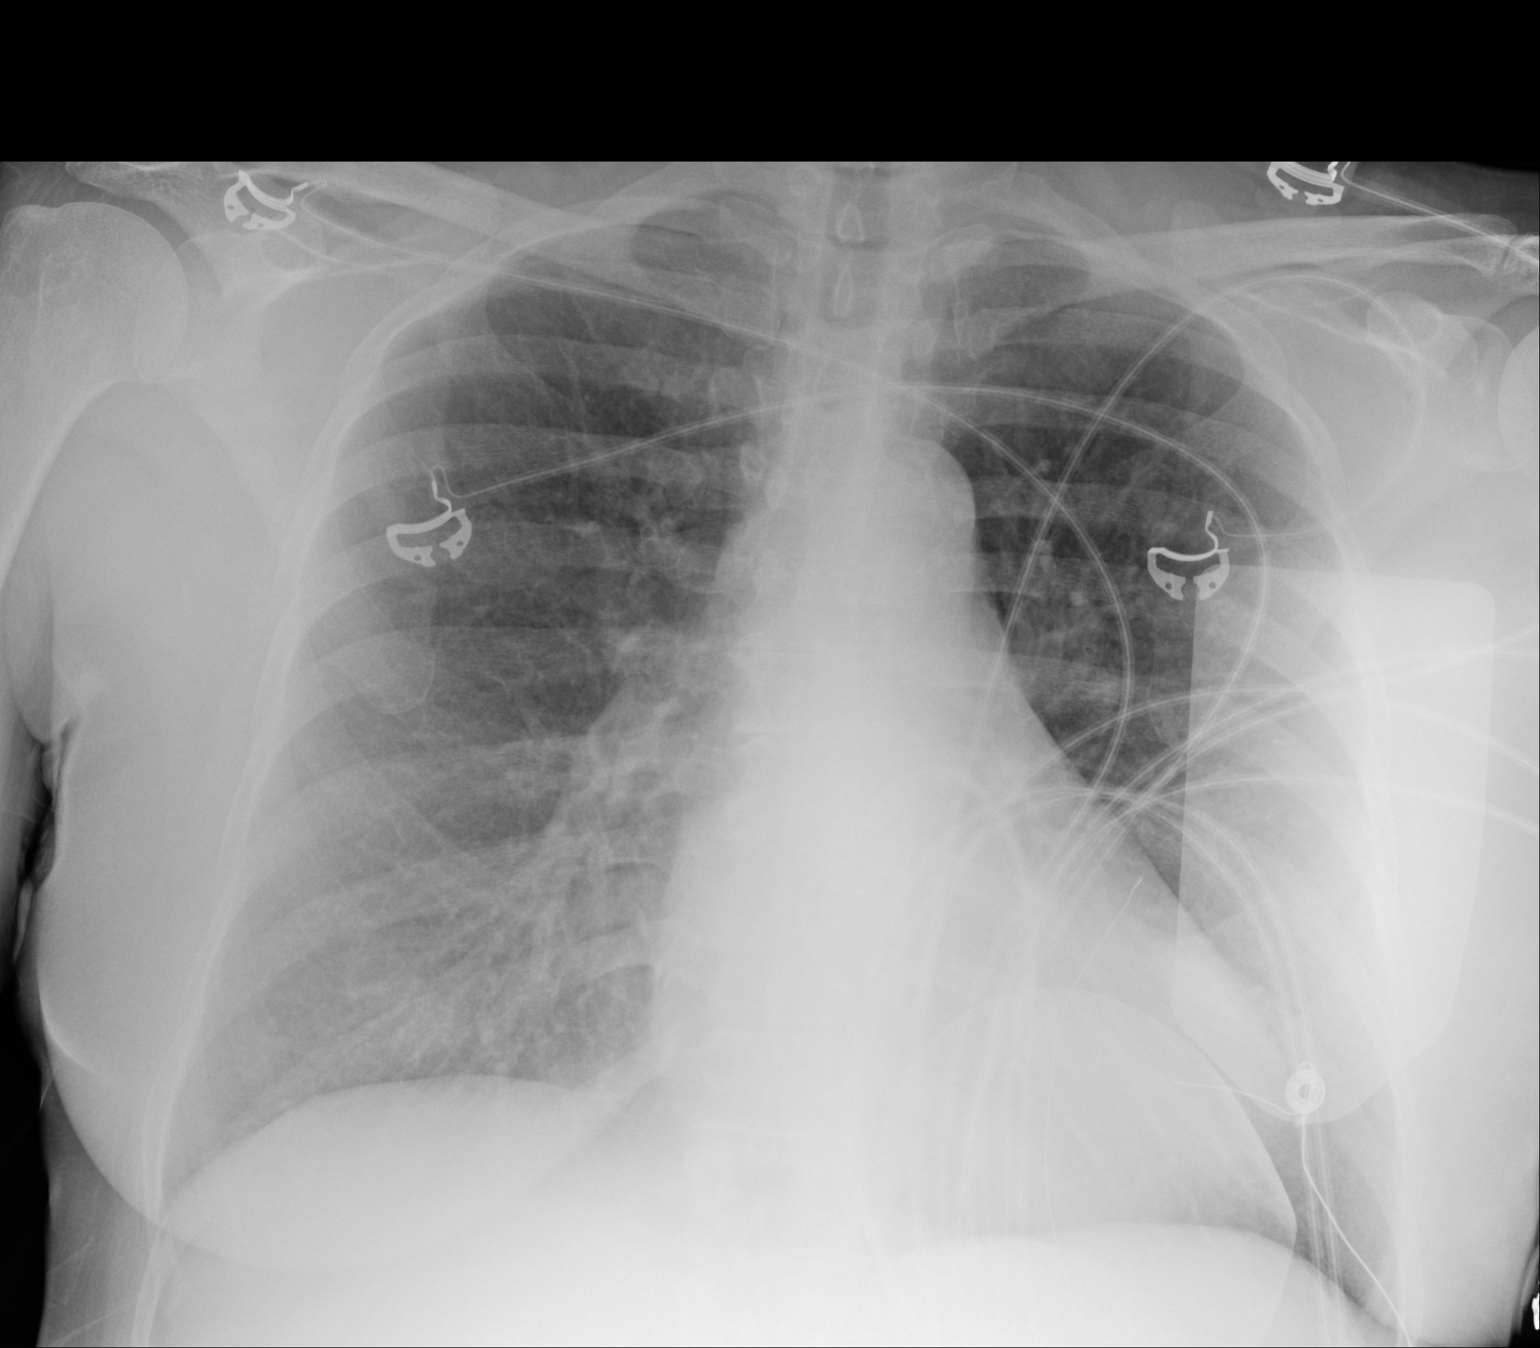

[1 of 1 positions shown; findings below may reference images not displayed]

FINDINGS: Heart is mildly enlarged. No confluent airspace opacities, effusions
or edema. No acute bony abnormality.
IMPRESSION: Cardiomegaly.  No active disease.

## 2018-08-02 ENCOUNTER — Telehealth: Payer: Self-pay | Admitting: Internal Medicine

## 2018-08-02 NOTE — Telephone Encounter (Signed)
Nicholaus Bloom,  Please review Ms Teston's chart and see if you think she would benefit from a pharmacy clinic visit for Repatha initiation.  I only saw her for SVT .  I am not aware of other CV indications.  Doesn't look like she has many risk factors otherwise.  Her last lipids in epic suggest Tchol 264, TG 365, LDL 143, HDL 48  If you don't think that she would qualify then we can just tell her so and save her a visit.  Thanks  Fayrene Fearing

## 2018-08-02 NOTE — Telephone Encounter (Signed)
Pt left message on my voicemail that her insurance won't cover her Repatha that her PCP Dr. Olena Leatherwood was prescribing. Per pt requesting to see if Dr. Johney Frame would prescribe it for her, hoping coming from Cardiologist they will cover Need to see if Allred is willing to do this and if so does she need an appt, last visit 08-2017. Pls advise [phone]

## 2018-08-03 NOTE — Telephone Encounter (Signed)
Thanks Nicholaus Bloom.  Boneta Lucks, please forward info to the patient.  She can discuss with PCP.

## 2018-08-03 NOTE — Telephone Encounter (Signed)
Pt is considered primary prevention with history of HTN and DM, both appear to be managed by diet and exercise. There is mention of mild atherosclerosis in aorta on ECHO. Her ASCVD 10 year risk is 16.2% putting her in the statin benefit group. Per our record, I do not see that she has been tried on statin therapy. I did scroll back and review PCP notes from 2017 and found that she has previously tried lipitor (which caused her kidney's to hurt per chart) and ezetimibe (which caused hot flashes) per chart. To meet criteria for PCSK9i for most insurance she will need to have tried at least one more statin - would recommend rosuvastatin 10mg  daily with titration if tolerated before initiation of PCSK9i therapy.

## 2018-08-05 NOTE — Telephone Encounter (Signed)
See MyChart message

## 2019-04-20 ENCOUNTER — Other Ambulatory Visit: Payer: Self-pay

## 2019-04-20 ENCOUNTER — Ambulatory Visit: Payer: Medicaid Other | Admitting: Student

## 2019-04-20 ENCOUNTER — Encounter: Payer: Self-pay | Admitting: Student

## 2019-04-20 VITALS — BP 148/84 | HR 99 | Ht 66.0 in | Wt 163.0 lb

## 2019-04-20 DIAGNOSIS — I471 Supraventricular tachycardia: Secondary | ICD-10-CM | POA: Diagnosis not present

## 2019-04-20 DIAGNOSIS — F419 Anxiety disorder, unspecified: Secondary | ICD-10-CM

## 2019-04-20 DIAGNOSIS — R0789 Other chest pain: Secondary | ICD-10-CM

## 2019-04-20 DIAGNOSIS — Z720 Tobacco use: Secondary | ICD-10-CM | POA: Diagnosis not present

## 2019-04-20 NOTE — Progress Notes (Signed)
PCP:  Toma DeitersHasanaj, Xaje A, MD Primary Cardiologist: No primary care provider on file. Electrophysiologist: None   Faith Blair is a 58 y.o. female who presents today for routine electrophysiology followup. They are seen for Dr Johney FrameAllred. Since last being seen in our clinic, the patient reports doing well overall.  Approx 2-3 weeks ago, pt was in a high stress situation where her dogs had fleas. This did not improve with an exterminator and affected her household, requiring multiple days of significant, deep cleaning. She noted palpitations during this time. She was previously started on diltiazem by her PCP (> 1 year ago) for BP per pt, he thought "she needed to be on something since she had an ablation.".  Since resolving the pet situation, she has had no further palpitations.  She reports a pain in her chest with working outside on occasion. She also reports this with periods of high stress. Does not seem exertional, seems more related to the specific movements or work she does.  She has been prescribed ativan for stress, which she does not take. She continues to smoke (>40 yr pack history).   The patient feels that she is tolerating medications without difficulties and is otherwise without complaint today.   Past Medical History:  Diagnosis Date  . Anxiety   . Complication of anesthesia    CERVICAL- HEADACHE  X 1 MONTH  . Dysrhythmia   . Meningitis spinal   . Multilevel degenerative disc disease   . Pre-diabetes   . Sciatic nerve disease    "RIGHT ARM TO FINGERS HAS PINS+ NEEDLES SENSATION"  . SVT (supraventricular tachycardia) (HCC)    Past Surgical History:  Procedure Laterality Date  . CERVICAL FUSION  2011  . DILATION AND CURETTAGE OF UTERUS    . FOOT SURGERY     SMALL LITTLE TOE  . SVT ABLATION N/A 08/06/2017   Procedure: SVT ABLATION;  Surgeon: Hillis RangeAllred, James, MD;  [facility];  Service: Cardiovascular;  Laterality: N/A;  . TEE WITHOUT CARDIOVERSION  08/06/2017   Procedure: Transesophageal Echocardiogram (Tee);  Surgeon: Hillis RangeAllred, James, MD;  [facility];  Service: Cardiovascular;;    Current Outpatient Medications  Medication Sig Dispense Refill  . ASTAXANTHIN PO Take 12 mg by mouth daily.    Claris Gower. Barberry-Oreg Grape-Goldenseal (BERBERINE COMPLEX PO) Take 1 tablet by mouth 2 (two) times daily.    . Coenzyme Q10 (COQ10) 200 MG CAPS Take 200 mg by mouth daily.    Marland Kitchen. diltiazem (CARDIZEM CD) 120 MG 24 hr capsule Take 120 mg by mouth daily.    . fluticasone (FLONASE) 50 MCG/ACT nasal spray Place 1 spray into both nostrils daily.     . IBU 800 MG tablet TAKE 1 Tablet  BY MOUTH EVERY 8 HOURS AS NEEDED (Patient taking differently: Take 800 mg by mouth every 8 hours as needed for pain) 90 tablet 1  . loratadine (CLARITIN) 10 MG tablet Take 10 mg by mouth daily as needed for allergies.    Marland Kitchen. LORazepam (ATIVAN) 0.5 MG tablet Take 0.5 mg by mouth at bedtime as needed for sleep.     . Magnesium Bisglycinate (MAG GLYCINATE PO) Take 2 tablets by mouth daily.    . Omega-3 Fatty Acids (FISH OIL) 1000 MG CAPS Take 2,000 mg by mouth daily.    Marland Kitchen. SPIRULINA PO Take 2,000 mg by mouth daily.      No current facility-administered medications for this visit.     Allergies  Allergen  Reactions  . Bee Venom Anaphylaxis  . Augmentin [Amoxicillin-Pot Clavulanate] Nausea And Vomiting    Has patient had a PCN reaction causing immediate rash, facial/tongue/throat swelling, SOB or lightheadedness with hypotension: No Has patient had a PCN reaction causing severe rash involving mucus membranes or skin necrosis: No Has patient had a PCN reaction that required hospitalization: No Has patient had a PCN reaction occurring within the last 10 years: Yes If all of the above answers are "NO", then may proceed with Cephalosporin use.   . Ciprofloxacin Other (See Comments)    Muscle pain  . Digoxin And Related     Rapid HR, facial swelling, elevated BP  . Floxin [Ofloxacin]      myalgia  . Levaquin [Levofloxacin] Nausea And Vomiting  . Codeine Nausea And Vomiting  . Sulfa Antibiotics Rash    Social History   Socioeconomic History  . Marital status: Divorced    Spouse name: Not on file  . Number of children: Not on file  . Years of education: Not on file  . Highest education level: Not on file  Occupational History  . Not on file  Social Needs  . Financial resource strain: Not on file  . Food insecurity    Worry: Not on file    Inability: Not on file  . Transportation needs    Medical: Not on file    Non-medical: Not on file  Tobacco Use  . Smoking status: Current Every Day Smoker    Packs/day: 0.50    Years: 38.00    Pack years: 19.00    Types: Cigarettes  . Smokeless tobacco: Never Used  Substance and Sexual Activity  . Alcohol use: No  . Drug use: No  . Sexual activity: Not on file  Lifestyle  . Physical activity    Days per week: Not on file    Minutes per session: Not on file  . Stress: Not on file  Relationships  . Social Musician on phone: Not on file    Gets together: Not on file    Attends religious service: Not on file    Active member of club or organization: Not on file    Attends meetings of clubs or organizations: Not on file    Relationship status: Not on file  . Intimate partner violence    Fear of current or ex partner: Not on file    Emotionally abused: Not on file    Physically abused: Not on file    Forced sexual activity: Not on file  Other Topics Concern  . Not on file  Social History Narrative  . Not on file   Review of Systems: General: No chills, fever, night sweats or weight changes  Cardiovascular:  No chest pain, dyspnea on exertion, edema, orthopnea, palpitations, paroxysmal nocturnal dyspnea Dermatological: No rash, lesions or masses Respiratory: No cough, dyspnea Urologic: No hematuria, dysuria Abdominal: No nausea, vomiting, diarrhea, bright red blood per rectum, melena, or  hematemesis Neurologic: No visual changes, weakness, changes in mental status All other systems reviewed and are otherwise negative except as noted above.  Physical Exam: Vitals:   04/20/19 1213  BP: (!) 148/84  Pulse: 99  Weight: 163 lb (73.9 kg)  Height: 5\' 6"  (1.676 m)   GEN- The patient is well appearing, alert and oriented x 3 today.   HEENT: normocephalic, atraumatic; sclera clear, conjunctiva pink; hearing intact; oropharynx clear; neck supple, no JVP Lymph- no cervical lymphadenopathy Lungs- Clear to  ausculation bilaterally, normal work of breathing.  No wheezes, rales, rhonchi Heart- Regular rate and rhythm, no murmurs, rubs or gallops, PMI not laterally displaced GI- soft, non-tender, non-distended, bowel sounds present, no hepatosplenomegaly Extremities- no clubbing, cyanosis, or edema; DP/PT/radial pulses 2+ bilaterally MS- no significant deformity or atrophy Skin- warm and dry, no rash or lesion Psych- euthymic mood, full affect Neuro- strength and sensation are intact  EKG is ordered today. Personal review shows NSR at 99 bpm, PR 186 ms, QRS 88 ms.  Assessment and Plan:  1. Ectopic atrial tachycardia  Resolved s/p ablation; though recently had episodes of palpitations that felt similar, though "nowhere near as severe" as her previous episodes.  Off Eliquis Previously placed on diltiazem 120 mg daily by her PCP.  She has not had any symptoms in the past 2 weeks. She prefers to defer any medication change at this time. Could consider increasing diltiazem.  2. Tobacco abuse Encouraged cessation  3. Atypical chest pain She reports two aggravating factors, specific movements with yardwork and periods of high stress. No clear exertional chest pain.  I suspect her pre-test probability would be quite low, though she does have a significant smoking history.  Reviewed alarm symptoms such as chest pain that comes on with exertion and does not improve with rest, especially  with accompanied by breathlessness.  She verbalizes understanding to seek immediate care in these circumstances. She will call if this recurs, or worsens in frequency or severity.  4. Anxiety Suspect large contributor to her symptoms.  Encouraged PCP follow up.   Shirley Friar, PA-C  04/20/19 12:26 PM

## 2019-04-20 NOTE — Patient Instructions (Signed)
Medication Instructions:  Your physician recommends that you continue on your current medications as directed. Please refer to the Current Medication list given to you today.  If you need a refill on your cardiac medications before your next appointment, please call your pharmacy.   Lab work: NONE ORDERED  TODAY'   If you have labs (blood work) drawn today and your tests are completely normal, you will receive your results only by: . MyChart Message (if you have MyChart) OR . A paper copy in the mail If you have any lab test that is abnormal or we need to change your treatment, we will call you to review the results.  Testing/Procedures: NONE ORDERED  TODAY    Follow-Up: At [facility], you and your health needs are our priority.  As part of our continuing mission to provide you with exceptional heart care, we have created designated Provider Care Teams.  These Care Teams include your primary Cardiologist (physician) and Advanced Practice Providers (APPs -  Physician Assistants and Nurse Practitioners) who all work together to provide you with the care you need, when you need it. You will need a follow up appointment in 6 months.  Please call our office 2 months in advance to schedule this appointment.  You may see Dr. Allred  or one of the following Advanced Practice Providers on your designated Care Team:   Amber Seiler, NP . Renee Ursuy, PA-C . Andrew Tillery PA-C  Any Other Special Instructions Will Be Listed Below (If Applicable).    

## 2021-06-07 ENCOUNTER — Telehealth: Payer: Self-pay

## 2021-06-07 NOTE — Telephone Encounter (Signed)
NOTES SCANNED TO REFERRALS

## 2022-10-13 ENCOUNTER — Encounter: Payer: Self-pay | Admitting: *Deleted

## 2022-10-14 ENCOUNTER — Ambulatory Visit: Payer: Medicaid Other | Attending: Internal Medicine | Admitting: Internal Medicine

## 2022-10-14 ENCOUNTER — Encounter: Payer: Self-pay | Admitting: Internal Medicine

## 2022-10-14 VITALS — BP 116/68 | HR 80 | Ht 66.0 in | Wt 155.4 lb

## 2022-10-14 DIAGNOSIS — R002 Palpitations: Secondary | ICD-10-CM

## 2022-10-14 DIAGNOSIS — I471 Supraventricular tachycardia, unspecified: Secondary | ICD-10-CM

## 2022-10-14 NOTE — Progress Notes (Signed)
Cardiology Office Note  Date: 10/14/2022   ID: Faith Blair, DOB Dec 22, 1960, MRN [id number]  PCP:  Neale Burly, MD  Cardiologist:  Chalmers Guest, MD Electrophysiologist:  None   Reason for Office Visit: Evaluation of SVT at the request of Dr. Sherrie Sport   History of Present Illness: Faith Blair is a 62 y.o. female known to have SVT s/p ablation at [facility] in 2019, HTN, HLD, nicotine abuse was referred to cardiology clinic for evaluation of palpitations.  Patient tried atorvastatin in the past and did not tolerate due to some kidney issues per documentation. She tried Zetia with some hot flashes. She did not qualify for PCSK9 inhibitors as she did not want to try additional statin for insurance purposes. Patient is here to establish care. Has no palpitations after her ablation. Had some chest discomfort after carrying her 9 pound dog around but no angina when she performs household chores.  This could be musculoskeletal in etiology as the pain got better when she was moving her left arm. Denies any DOE (has baseline SOB due to chronic smoking), dizziness, lightheadedness, syncope, leg swelling, claudication. Currently smokes 10 cigarettes/day.  Past Medical History:  Diagnosis Date   Anxiety    Complication of anesthesia    CERVICAL- HEADACHE  X 1 MONTH   Dysrhythmia    Meningitis spinal    Multilevel degenerative disc disease    Pre-diabetes    Sciatic nerve disease    "RIGHT ARM TO FINGERS HAS PINS+ NEEDLES SENSATION"   SVT (supraventricular tachycardia) (HCC)     Past Surgical History:  Procedure Laterality Date   CERVICAL FUSION  2011   DILATION AND CURETTAGE OF UTERUS     FOOT SURGERY     SMALL LITTLE TOE   SVT ABLATION N/A 08/06/2017   Procedure: SVT ABLATION;  Surgeon: Thompson Grayer, MD;  Location: [place] CV [facility];  Service: Cardiovascular;  Laterality: N/A;   TEE WITHOUT CARDIOVERSION  08/06/2017   Procedure: Transesophageal Echocardiogram (Tee);  Surgeon:  Thompson Grayer, MD;  Location: [place] CV [facility];  Service: Cardiovascular;;    Current Outpatient Medications  Medication Sig Dispense Refill   ASTAXANTHIN PO Take 12 mg by mouth daily.     Barberry-Oreg Grape-Goldenseal (BERBERINE COMPLEX PO) Take 1 tablet by mouth 2 (two) times daily.     Coenzyme Q10 (COQ10) 200 MG CAPS Take 200 mg by mouth daily.     diltiazem (CARDIZEM CD) 120 MG 24 hr capsule Take 120 mg by mouth daily.     fluticasone (FLONASE) 50 MCG/ACT nasal spray Place 1 spray into both nostrils daily.      IBU 800 MG tablet TAKE 1 Tablet  BY MOUTH EVERY 8 HOURS AS NEEDED (Patient taking differently: Take 800 mg by mouth every 8 hours as needed for pain) 90 tablet 1   loratadine (CLARITIN) 10 MG tablet Take 10 mg by mouth daily as needed for allergies.     LORazepam (ATIVAN) 0.5 MG tablet Take 0.5 mg by mouth at bedtime as needed for sleep.      Magnesium Bisglycinate (MAG GLYCINATE PO) Take 2 tablets by mouth daily.     Omega-3 Fatty Acids (FISH OIL) 1000 MG CAPS Take 2,000 mg by mouth daily.     SPIRULINA PO Take 2,000 mg by mouth daily.      No current facility-administered medications for this visit.   Allergies:  Bee venom, Augmentin [amoxicillin-pot clavulanate], Ciprofloxacin, Digoxin and related, Floxin [ofloxacin], Levaquin [levofloxacin], Codeine,  and Sulfa antibiotics   Social History: The patient  reports that she has been smoking cigarettes. She has a 19.00 pack-year smoking history. She has never used smokeless tobacco. She reports that she does not drink alcohol and does not use drugs.   Family History: The patient's family history includes Alzheimer's disease in her mother.   ROS:  Please see the history of present illness. Otherwise, complete review of systems is positive for none.  All other systems are reviewed and negative.   Physical Exam: VS:  There were no vitals taken for this visit., BMI There is no height or weight on file to calculate BMI.  Wt  Readings from Last 3 Encounters:  04/20/19 163 lb (73.9 kg)  10/08/17 164 lb (74.4 kg)  09/07/17 155 lb (70.3 kg)    General: Patient appears comfortable at rest. HEENT: Conjunctiva and lids normal, oropharynx clear with moist mucosa. Neck: Supple, no elevated JVP or carotid bruits, no thyromegaly. Lungs: Clear to auscultation, nonlabored breathing at rest. Cardiac: Regular rate and rhythm, no S3 or significant systolic murmur, no pericardial rub. Abdomen: Soft, nontender, no hepatomegaly, bowel sounds present, no guarding or rebound. Extremities: No pitting edema, distal pulses 2+. Skin: Warm and dry. Musculoskeletal: No kyphosis. Neuropsychiatric: Alert and oriented x3, affect grossly appropriate.  ECG: NSR  Recent Labwork: No results found for requested labs within last 365 days.     Component Value Date/Time   CHOL 264 (H) 02/23/2017 0944   TRIG 365 (H) 02/23/2017 0944   HDL 48 02/23/2017 0944   CHOLHDL 5.5 02/23/2017 0944   VLDL 73 (H) 02/23/2017 0944   LDLCALC 143 (H) 02/23/2017 0944    Other Studies Reviewed Today:   Assessment and Plan: Patient is a 62 year old F known to have SVT s/p ablation at [facility] in 2019, HTN, HLD, nicotine abuse was referred to cardiology clinic for evaluation of palpitations.  # SVT s/p ablation -Continue metoprolol tartrate 50 mg twice daily  # HLD # Self-reported statin intolerance -Continue Zetia 10 mg once daily and berberine (although there are no randomized control trials to prove the efficacy of berberine in reducing lipids but there are a few observational studies/meta-analysis that confirmed reduction of LDL, VLDL, TG and increase in HDL levels). Patient is not interested to try any statin at this time. She will get a repeat lipid panel with her PCP and if her LDL is more than 100, will probably have to talk to her again about starting rosuvastatin.  # Nicotine abuse -Currently smoking 10 cigarettes/day. Smoking cessation counseling  provided and patient is motivated to quit.  She is using nicotine patches. Smoking cessation instruction/counseling given:  counseled patient on the dangers of tobacco use, advised patient to stop smoking, and reviewed strategies to maximize success    I have spent a total of 45 minutes with patient reviewing chart, EKGs, labs and examining patient as well as establishing an assessment and plan that was discussed with the patient.  > 50% of time was spent in direct patient care.     Medication Adjustments/Labs and Tests Ordered: Current medicines are reviewed at length with the patient today.  Concerns regarding medicines are outlined above.   Tests Ordered: Orders Placed This Encounter  Procedures   EKG 12-Lead    Medication Changes: No orders of the defined types were placed in this encounter.   Disposition:  Follow up  1 year  Signed Biff Rutigliano Fidel Levy, MD, 10/14/2022 10:59 AM    [facility] [place], [place], [place] [address]

## 2022-10-14 NOTE — Patient Instructions (Addendum)

## 2023-10-12 ENCOUNTER — Encounter: Payer: Self-pay | Admitting: Internal Medicine

## 2024-05-17 ENCOUNTER — Telehealth: Payer: Self-pay | Admitting: Internal Medicine

## 2024-05-17 ENCOUNTER — Encounter: Payer: Self-pay | Admitting: Internal Medicine

## 2024-05-17 NOTE — Telephone Encounter (Signed)
 Pt said they her Dr.Hasanaj wanted her to have a stress test but ins denied it. She was having some issues but have since resolved. He wants her to be see so we can order a stress test in hope that the Insurance approves it . I have scheduled her for our soonest appt and placed on the waitlist. Pt is also calling Dr.Hasanaj to fax us  records incase we need them.

## 2024-06-03 ENCOUNTER — Ambulatory Visit: Payer: Self-pay | Admitting: Internal Medicine

## 2024-06-08 NOTE — Telephone Encounter (Signed)
-----   Message from [place] JONELLE Cable sent at 06/08/2024  3:56 PM EST -----

## 2024-06-08 NOTE — Telephone Encounter (Signed)
 Spoke with patient regarding ordering Lexiscan. She stated that she would rather have a test that isn't medication induced. Would prefer to have a different type of test without having contrast or medication. She reports no chest pain, Still having some SOB. She thinks it may come from smoking for so long. Please advise.

## 2024-06-08 NOTE — Telephone Encounter (Signed)
 Pt returning call

## 2024-07-25 ENCOUNTER — Encounter: Payer: Self-pay | Admitting: Internal Medicine

## 2024-07-25 ENCOUNTER — Ambulatory Visit: Attending: Internal Medicine | Admitting: Internal Medicine

## 2024-07-25 VITALS — BP 160/86 | HR 78 | Ht 66.0 in | Wt 158.2 lb

## 2024-07-25 DIAGNOSIS — M79605 Pain in left leg: Secondary | ICD-10-CM | POA: Diagnosis not present

## 2024-07-25 DIAGNOSIS — R0609 Other forms of dyspnea: Secondary | ICD-10-CM | POA: Insufficient documentation

## 2024-07-25 DIAGNOSIS — I471 Supraventricular tachycardia, unspecified: Secondary | ICD-10-CM | POA: Insufficient documentation

## 2024-07-25 DIAGNOSIS — M79604 Pain in right leg: Secondary | ICD-10-CM | POA: Insufficient documentation

## 2024-07-25 DIAGNOSIS — E781 Pure hyperglyceridemia: Secondary | ICD-10-CM | POA: Insufficient documentation

## 2024-07-25 DIAGNOSIS — I70313 Atherosclerosis of unspecified type of bypass graft(s) of the extremities with intermittent claudication, bilateral legs: Secondary | ICD-10-CM | POA: Diagnosis not present

## 2024-07-25 DIAGNOSIS — R079 Chest pain, unspecified: Secondary | ICD-10-CM | POA: Diagnosis not present

## 2024-07-25 NOTE — Progress Notes (Signed)
 "    Cardiology Office Note  Date: 07/25/2024   ID: Faith Blair, DOB 05/25/1961, MRN [id number]  PCP:  Faith Yancey LABOR, MD  Cardiologist:  Faith Arentz P Dyana Magner, MD Electrophysiologist:  None   Reason for Office Visit: Chest pain   History of Present Illness: Faith Blair is a 64 y.o. female known to have SVT s/p ablation at [facility] in 2019, HTN, HLD, nicotine abuse is here today for follow-up visit.  Patient reported that she tried multiple statins in the past and did not tolerate.  She does not recall the names. She did not qualify for PCSK9 inhibitors as she did not want to try additional statin for insurance purposes.  She recently underwent chest pains and has chronic DOE.  Her PCP wanted her to get a stress test.  But did not want to take any chemicals to get the stress test done.  She also cannot do treadmill test either due to DOE.  She continues to smoke 1 cigarettes/day.  Does not have dizziness, lightheadedness, syncope, palpitations, leg swelling.  Past Medical History:  Diagnosis Date   Anxiety    Complication of anesthesia    CERVICAL- HEADACHE  X 1 MONTH   Dysrhythmia    Meningitis spinal    Multilevel degenerative disc disease    Pre-diabetes    Sciatic nerve disease    RIGHT ARM TO FINGERS HAS PINS+ NEEDLES SENSATION   SVT (supraventricular tachycardia)     Past Surgical History:  Procedure Laterality Date   CERVICAL FUSION  2011   DILATION AND CURETTAGE OF UTERUS     FOOT SURGERY     SMALL LITTLE TOE   SVT ABLATION N/A 08/06/2017   Procedure: SVT ABLATION;  Surgeon: Faith Agent, MD;  Location: [facility];  Service: Cardiovascular;  Laterality: N/A;   TEE WITHOUT CARDIOVERSION  08/06/2017   Procedure: Transesophageal Echocardiogram (Tee);  Surgeon: Faith Agent, MD;  Location: [facility] INVASIVE [facility] LAB;  Service: Cardiovascular;;    Current Outpatient Medications  Medication Sig Dispense Refill   ASTAXANTHIN PO Take 12 mg by mouth daily.     Barberry-Oreg  Grape-Goldenseal (BERBERINE COMPLEX PO) Take 1 tablet by mouth daily.     Chromium Picolinate 1000 MCG TABS Take 1 tablet by mouth daily.     Coenzyme Q10 (COQ10) 200 MG CAPS Take 200 mg by mouth daily.     D-Mannose 500 MG CAPS Take 1 tablet by mouth daily.     ibuprofen  (ADVIL ) 800 MG tablet Take 800 mg by mouth 2 (two) times daily.     LORazepam (ATIVAN) 0.5 MG tablet Take 0.5 mg by mouth at bedtime as needed for sleep.      Magnesium Bisglycinate (MAG GLYCINATE PO) Take 2 tablets by mouth daily.     metoprolol  tartrate (LOPRESSOR ) 50 MG tablet Take 50 mg by mouth 2 (two) times daily.     Omega-3 Fatty Acids (FISH OIL) 1000 MG CAPS Take 2,000 mg by mouth daily.     QUERCETIN PO Take 1 tablet by mouth as needed.     SPIRULINA PO Take 2,000 mg by mouth daily.      No current facility-administered medications for this visit.   Allergies:  Bee venom, Augmentin [amoxicillin-pot clavulanate], Ciprofloxacin, Digoxin  and related, Floxin [ofloxacin], Levaquin [levofloxacin], Codeine, and Sulfa antibiotics   Social History: The patient  reports that she has been smoking cigarettes. She has a 19 pack-year smoking history. She has never used smokeless tobacco. She reports that  she does not drink alcohol and does not use drugs.   Family History: The patient's family history includes Alzheimer's disease in her mother.   ROS:  Please see the history of present illness. Otherwise, complete review of systems is positive for none.  All other systems are reviewed and negative.   Physical Exam: VS:  There were no vitals taken for this visit., BMI There is no height or weight on file to calculate BMI.  Wt Readings from Last 3 Encounters:  10/14/22 155 lb 6.4 oz (70.5 kg)  04/20/19 163 lb (73.9 kg)  10/08/17 164 lb (74.4 kg)    General: Patient appears comfortable at rest. HEENT: Conjunctiva and lids normal, oropharynx clear with moist mucosa. Neck: Supple, no elevated JVP or carotid bruits, no  thyromegaly. Lungs: Clear to auscultation, nonlabored breathing at rest. Cardiac: Regular rate and rhythm, no S3 or significant systolic murmur, no pericardial rub. Abdomen: Soft, nontender, no hepatomegaly, bowel sounds present, no guarding or rebound. Extremities: No pitting edema, distal pulses 2+. Skin: Warm and dry. Musculoskeletal: No kyphosis. Neuropsychiatric: Alert and oriented x3, affect grossly appropriate.  ECG: NSR  Recent Labwork: No results found for requested labs within last 365 days.     Component Value Date/Time   CHOL 264 (H) 02/23/2017 0944   TRIG 365 (H) 02/23/2017 0944   HDL 48 02/23/2017 0944   CHOLHDL 5.5 02/23/2017 0944   VLDL 73 (H) 02/23/2017 0944   LDLCALC 143 (H) 02/23/2017 0944    Assessment and Plan:  # Chest pain and DOE - Patient recently underwent chest pains and also has chronic DOE.  Discussed obtaining Lexiscan versus exercise Myoview.  Patient did not want any chemicals injected.  Obtain CT cardiac for evaluation of chest pain.  Obtain BNP for DOE.  # SVT s/p ablation - Continue metoprolol  tartrate 50 mg twice daily  # HLD # Hypertriglyceridemia # Self-reported statin intolerance - Lipid panel from August 2025 reviewed.  TG 649, elevated and LDL 148, elevated.  Patient reported statin intolerance in the past. Takes over-the-counter fish oil supplements.  Continue Zetia  10 mg once daily.  Discussed about initiating Repatha, she wants to think about it.  # Nicotine abuse - Currently smoking 10 cigarettes/day.  Counseling provided.  # Leg claudication - Obtain ABI.  Reports buttock claudication with incline walking.  30-minute spent in reviewing prior medical records, reports, more than 30 labs, discussion and documentation.   Medication Adjustments/Labs and Tests Ordered: Current medicines are reviewed at length with the patient today.  Concerns regarding medicines are outlined above.   Tests Ordered: No orders of the defined types  were placed in this encounter.   Medication Changes: No orders of the defined types were placed in this encounter.   Disposition:  Follow up 1 year  Signed Faith Boom Priya Mairyn Lenahan, MD, 07/25/2024 11:18 AM    [facility] [address] [place], [place], [place] [address]  "

## 2024-07-25 NOTE — Patient Instructions (Addendum)
 Medication Instructions:  Your physician recommends that you continue on your current medications as directed. Please refer to the Current Medication list given to you today.  Labwork: [facility] ([address] [place]) or [facility]  Testing/Procedures: Your physician has requested that you have an ankle brachial index (ABI). During this test an ultrasound and blood pressure cuff are used to evaluate the arteries that supply the arms and legs with blood. Allow thirty minutes for this exam. There are no restrictions or special instructions.  Please note: We ask at that you not bring children with you during ultrasound (echo/ vascular) testing. Due to room size and safety concerns, children are not allowed in the ultrasound rooms during exams. Our front office staff cannot provide observation of children in our lobby area while testing is being conducted. An adult accompanying a patient to their appointment will only be allowed in the ultrasound room at the discretion of the ultrasound technician under special circumstances. We apologize for any inconvenience.  Follow-Up: Your physician recommends that you schedule a follow-up appointment in: pending  Any Other Special Instructions Will Be Listed Below (If Applicable).  If you need a refill on your cardiac medications before your next appointment, please call your pharmacy.    Your cardiac CT will be scheduled at one of the below locations:   [facility] [address] [place], [place] [address] [phone] (Severe contrast allergies only)  OR   [facility] [address] [place], [place] [address] [phone]  OR   [facility] [address] [place], [place] [address] [phone]  OR   [facility] [address]  [place], [place] [address]  If scheduled at [facility], please arrive at the  [facility] En[facility] (Entrance C2) of [facility] 30 minutes prior to test start time. You can use the FREE valet parking offered at entrance C (encouraged to control the heart rate for the test)  Proceed to the [facility] Radiology Department (first floor) to check-in and test prep.  All radiology patients and guests should use entrance C2 at [facility], accessed from [facility], even though the hospital's physical address listed is [address]  If scheduled at the Heart and Vascular Tower at [address], please enter the parking lot using the [address] entrance and use the FREE valet service at the patient drop-off area. Enter the building and check-in with registration on the main floor.  If scheduled at [facility], please arrive to the Heart and Vascular Center 15 mins early for check-in and test prep.  There is spacious parking and easy access to the radiology department from the [facility] V[facility] entrance. Please enter here and check-in with the desk attendant.   If scheduled at [facility], please arrive 30 minutes early for check-in and test prep.  Please follow these instructions carefully (unless otherwise directed):  An IV will be required for this test and Nitroglycerin will be given.  Hold all erectile dysfunction medications at least 3 days (72 hrs) prior to test. (Ie viagra, cialis, sildenafil, tadalafil, etc)   On the Night Before the Test: Be sure to Drink plenty of water. Do not consume any caffeinated/decaffeinated beverages or chocolate 12 hours prior to your test. Do not take any antihistamines 12 hours prior to your test.  If the patient  has contrast allergy: No contrast allergy  On the Day of the Test: Drink plenty of water until 1 hour prior to the test. Do not eat any food 1 hour prior to test. You may take your regular medications prior to the test.  Take  metoprolol  (Lopressor ) 100 mg two hours prior to test. If you take Furosemide/Hydrochlorothiazide/Spironolactone/Chlorthalidone, please HOLD on the morning of the test. Patients who wear a continuous glucose monitor MUST remove the device prior to scanning. FEMALES- please wear underwire-free bra if available, avoid dresses & tight clothing  After the Test: Drink plenty of water. After receiving IV contrast, you may experience a mild flushed feeling. This is normal. On occasion, you may experience a mild rash up to 24 hours after the test. This is not dangerous. If this occurs, you can take Benadryl 25 mg, Zyrtec, Claritin, or Allegra and increase your fluid intake. (Patients taking Tikosyn should avoid Benadryl, and may take Zyrtec, Claritin, or Allegra) If you experience trouble breathing, this can be serious. If it is severe call 911 IMMEDIATELY. If it is mild, please call our office.  We will call to schedule your test 2-4 weeks out understanding that some insurance companies will need an authorization prior to the service being performed.   For more information and frequently asked questions, please visit our website : [email or web address]  For non-scheduling related questions, please contact the cardiac imaging nurse navigator should you have any questions/concerns: Cardiac Imaging Nurse Navigators Direct Office Dial: [phone]   For scheduling needs, including cancellations and rescheduling, please call Brittany, [phone].

## 2024-08-08 ENCOUNTER — Ambulatory Visit: Attending: Internal Medicine

## 2024-08-08 DIAGNOSIS — M79605 Pain in left leg: Secondary | ICD-10-CM | POA: Insufficient documentation

## 2024-08-08 DIAGNOSIS — M79604 Pain in right leg: Secondary | ICD-10-CM | POA: Diagnosis not present

## 2024-08-08 LAB — VAS US ABI WITH/WO TBI
Left ABI: 0.71
Right ABI: 0.84

## 2024-08-24 ENCOUNTER — Ambulatory Visit: Payer: Self-pay | Admitting: Internal Medicine
